# Patient Record
Sex: Male | Born: 1995 | Race: White | Hispanic: No | Marital: Single
Health system: Midwestern US, Community
[De-identification: ages and names within clinical notes are randomized; demographics above are authoritative.]

## PROBLEM LIST (undated history)

## (undated) DIAGNOSIS — J45909 Unspecified asthma, uncomplicated: Secondary | ICD-10-CM

## (undated) DIAGNOSIS — F909 Attention-deficit hyperactivity disorder, unspecified type: Secondary | ICD-10-CM

## (undated) DIAGNOSIS — F418 Other specified anxiety disorders: Secondary | ICD-10-CM

## (undated) DIAGNOSIS — R51 Headache: Secondary | ICD-10-CM

## (undated) DIAGNOSIS — Z8619 Personal history of other infectious and parasitic diseases: Secondary | ICD-10-CM

## (undated) DIAGNOSIS — R519 Headache, unspecified: Secondary | ICD-10-CM

## (undated) DIAGNOSIS — R12 Heartburn: Secondary | ICD-10-CM

## (undated) HISTORY — DX: Headache: R51

## (undated) HISTORY — PX: NO PAST SURGERIES: SHX2092

## (undated) HISTORY — DX: Heartburn: R12

## (undated) HISTORY — DX: Personal history of other infectious and parasitic diseases: Z86.19

## (undated) HISTORY — DX: Headache, unspecified: R51.9

## (undated) HISTORY — DX: Other specified anxiety disorders: F41.8

## (undated) HISTORY — DX: Attention-deficit hyperactivity disorder, unspecified type: F90.9

## (undated) HISTORY — DX: Unspecified asthma, uncomplicated: J45.909

---
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
## 2016-01-03 ENCOUNTER — Encounter: Payer: Self-pay | Admitting: Family Medicine

## 2016-01-04 ENCOUNTER — Encounter: Payer: Self-pay | Admitting: Primary Care

## 2016-01-07 ENCOUNTER — Ambulatory Visit (INDEPENDENT_AMBULATORY_CARE_PROVIDER_SITE_OTHER): Payer: BLUE CROSS/BLUE SHIELD | Admitting: Family Medicine

## 2016-01-07 ENCOUNTER — Encounter: Payer: Self-pay | Admitting: Family Medicine

## 2016-01-07 VITALS — BP 122/78 | HR 80 | Temp 97.9°F | Wt 121.8 lb

## 2016-01-07 DIAGNOSIS — F418 Other specified anxiety disorders: Secondary | ICD-10-CM

## 2016-01-07 DIAGNOSIS — Z23 Encounter for immunization: Secondary | ICD-10-CM | POA: Diagnosis not present

## 2016-01-07 DIAGNOSIS — F902 Attention-deficit hyperactivity disorder, combined type: Secondary | ICD-10-CM | POA: Diagnosis not present

## 2016-01-07 DIAGNOSIS — J4530 Mild persistent asthma, uncomplicated: Secondary | ICD-10-CM

## 2016-01-07 DIAGNOSIS — N529 Male erectile dysfunction, unspecified: Secondary | ICD-10-CM | POA: Insufficient documentation

## 2016-01-07 NOTE — [facility] Notes (Signed)
Pre visit review using our clinic review tool, if applicable. No additional management support is needed unless otherwise documented below in the visit note. 

## 2016-01-07 NOTE — Assessment & Plan Note (Addendum)
Endorses absent nocturnal tumescence and progressive onset of erectile dysfunction, although longstanding issue present. 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 Pt agrees with plan.

## 2016-01-07 NOTE — Progress Notes (Addendum)
BP 122/78   Pulse 80   Temp 97.9 F (36.6 C) (Oral)   Wt 121 lb 12 oz (55.2 kg)   BMI 17.85 kg/m    CC: erectile dysfunction, sexual dysfunction Subjective:    Patient ID: Albert Salas, male    DOB: February 05, 1996, 20 y.o.   MRN: [id number]  HPI: Albert Salas is a 20 y.o. male presenting on 01/07/2016 for Follow-up   See prior notes for details. Seen here 3 mo ago with commencement of wellbutrin 75mg  for depression/anxiety and ADHD. Also was taking adderall IR 15mg  daily for ADHD. Wellbutrin was decreased to once daily dosing due to noted tachycardia on my last visit.   Patient saw Anda Kraft NP 2 wks ago with ED concerns with normal genital exam last visit, thought psychologic in nature. labwork unrevealing (TSH, CBC, CMP, testosterone). Present when trying to be intimate with GF. Has not had successful sexual encounter yet. New relation over last 1.5 months. Very interested in this girl. Feels comfortable around GF. Denies significant life stressors. Decreased sex drive noted as well. Trouble with arousal both with GF and when alone. Endorses previous successful ejaculation but never associated with full erection. Rarely wakes up with an erection over the past year. He used to previously.   Facial hair since 7th grade. Puberty reached at 6th grade ~20 yo.  No fmhx developmental or congenital abnormalities.   Ran out of minocycline several months ago.   Going to school at [employer]. Playing gigs around town. Plays sax, bass guitar, clarinet and flute.   Alcohol - very rare.  No smoking.  Denies recreational drugs. Prior when at [facility] did have daily MJ use. Abstinent over >4 months.   Relevant past medical, surgical, family and social history reviewed and updated as indicated. Interim medical history since our last visit reviewed. Allergies and medications reviewed and updated. Current Outpatient Prescriptions on File Prior to Visit    Medication Sig  . mometasone (ASMANEX) 220 MCG/INH inhaler Inhale 1 puff into the lungs daily.  Marland Kitchen albuterol (PROAIR HFA) 108 (90 Base) MCG/ACT inhaler Inhale 2 puffs into the lungs every 6 (six) hours as needed for wheezing or shortness of breath.   No current facility-administered medications on file prior to visit.     Review of Systems Per HPI unless specifically indicated in ROS section     Objective:    BP 122/78   Pulse 80   Temp 97.9 F (36.6 C) (Oral)   Wt 121 lb 12 oz (55.2 kg)   BMI 17.85 kg/m   Wt Readings from Last 3 Encounters:  01/07/16 121 lb 12 oz (55.2 kg) (5 %, Z= -1.68)*  12/25/15 124 lb 12.8 oz (56.6 kg) (7 %, Z= -1.49)*  09/25/15 122 lb 8 oz (55.6 kg) (5 %, Z= -1.60)*   * Growth percentiles are based on CDC 2-20 Years data.    Physical Exam  Constitutional: He is oriented to person, place, and time. He appears well-developed and well-nourished. No distress.  HENT:  Mouth/Throat: Oropharynx is clear and moist. No oropharyngeal exudate.  Eyes: Conjunctivae are normal. Pupils are equal, round, and reactive to light.  Cardiovascular: Normal rate, regular rhythm, normal heart sounds and intact distal pulses.   No murmur heard. Pulmonary/Chest: Effort normal and breath sounds normal. No respiratory distress. He has no wheezes. He has no rales.  Abdominal: Soft. Bowel sounds are normal. He exhibits no distension and no mass. There is no  tenderness. There is no rebound and no guarding. Hernia confirmed negative in the right inguinal area and confirmed negative in the left inguinal area.  Genitourinary: Testes normal and penis normal. Right testis shows no mass, no swelling and no tenderness. Right testis is descended. Left testis shows no mass, no swelling and no tenderness. Left testis is descended. No phimosis, paraphimosis, hypospadias or penile tenderness. No discharge found.  Genitourinary Comments: No dorsal penile artery pulse appreciated  Musculoskeletal:  He exhibits no edema.  Lymphadenopathy:       Right: No inguinal adenopathy present.       Left: No inguinal adenopathy present.  Neurological: He is alert and oriented to person, place, and time.  Skin: Skin is warm and dry. No rash noted.  Psychiatric: He has a normal mood and affect.  Nursing note and vitals reviewed.  Results for orders placed or performed in visit on 12/25/15  Comprehensive metabolic panel  Result Value Ref Range   Sodium 141 135 - 145 mEq/L   Potassium 4.2 3.5 - 5.1 mEq/L   Chloride 104 96 - 112 mEq/L   CO2 34 (H) 19 - 32 mEq/L   Glucose, Bld 57 (L) 70 - 99 mg/dL   BUN 14 6 - 23 mg/dL   Creatinine, Ser 0.87 0.40 - 1.50 mg/dL   Total Bilirubin 0.8 0.2 - 1.2 mg/dL   Alkaline Phosphatase 57 52 - 171 U/L   AST 26 0 - 37 U/L   ALT 21 0 - 53 U/L   Total Protein 6.8 6.0 - 8.3 g/dL   Albumin 4.1 3.5 - 5.2 g/dL   Calcium 9.2 8.4 - 10.5 mg/dL   GFR 119.30 >60.00 mL/min  TSH  Result Value Ref Range   TSH 0.56 0.40 - 5.00 uIU/mL  Testosterone  Result Value Ref Range   Testosterone 361.15 200.00 - 970.00 ng/dL  CBC  Result Value Ref Range   WBC 4.6 4.5 - 13.5 K/uL   RBC 5.17 3.80 - 5.70 Mil/uL   Platelets 215.0 150.0 - 575.0 K/uL   Hemoglobin 15.6 12.0 - 16.0 g/dL   HCT 47.0 36.0 - 49.0 %   MCV 90.9 78.0 - 98.0 fl   MCHC 33.3 31.0 - 37.0 g/dL   RDW 13.3 11.4 - 15.5 %      Assessment & Plan:   Problem List Items Addressed This Visit    ADHD (attention deficit hyperactivity disorder)    adderall stopped >1 month ago. Feels ADHD stable off medication, functioning well at work and at school. Will remain off adderall at this time.       Asthma    Regular use of daily asmanex.       Depression with anxiety    wellbutrin stopped >1 month ago. Feels anxiety /depression stable at this time off antidepressant.       Erectile dysfunction - Primary    Endorses absent nocturnal tumescence and progressive onset of erectile dysfunction, although longstanding  issue present. 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 Pt agrees with plan.      Relevant Orders   Ambulatory referral to Urology    Other Visit Diagnoses    Need for influenza vaccination       Relevant Orders   Flu Vaccine QUAD 36+ mos PF IM (Fluarix & Fluzone Quad PF) (Completed)       Follow  up plan: No Follow-up on file.  Ria Bush, MD

## 2016-01-07 NOTE — Patient Instructions (Addendum)
Flu shot today.  Let's refer you to urologist for further evaluation.  Let's stay off adderall and wellbutrin for now.

## 2016-01-08 NOTE — Assessment & Plan Note (Signed)
adderall stopped >1 month ago. Feels ADHD stable off medication, functioning well at work and at school. Will remain off adderall at this time.

## 2016-01-08 NOTE — Assessment & Plan Note (Signed)
wellbutrin stopped >1 month ago. Feels anxiety /depression stable at this time off antidepressant.

## 2016-01-08 NOTE — Assessment & Plan Note (Signed)
Regular use of daily asmanex.

## 2016-01-08 NOTE — Addendum Note (Signed)
Addended by: Ria Bush on: 01/08/2016 07:24 AM   Modules accepted: Orders

## 2016-01-16 ENCOUNTER — Ambulatory Visit (INDEPENDENT_AMBULATORY_CARE_PROVIDER_SITE_OTHER): Payer: BLUE CROSS/BLUE SHIELD | Admitting: Urology

## 2016-01-16 ENCOUNTER — Encounter: Payer: Self-pay | Admitting: Urology

## 2016-01-16 VITALS — BP 148/71 | HR 62 | Ht 70.0 in | Wt 125.9 lb

## 2016-01-16 DIAGNOSIS — N529 Male erectile dysfunction, unspecified: Secondary | ICD-10-CM

## 2016-01-16 DIAGNOSIS — F3289 Other specified depressive episodes: Secondary | ICD-10-CM

## 2016-01-16 DIAGNOSIS — N486 Induration penis plastica: Secondary | ICD-10-CM | POA: Diagnosis not present

## 2016-01-16 MED ORDER — TADALAFIL 5 MG PO TABS: 5.0000 mg | ORAL_TABLET | Freq: Every day | ORAL | 3 refills | Status: DC | PRN

## 2016-01-16 NOTE — Progress Notes (Signed)
01/16/2016 3:28 PM   Albert Salas 02-25-1996 [id number]  Referring provider: Ria Bush, MD [address] [place], [place] [address]  Chief Complaint  Patient presents with  . Erectile Dysfunction    new patient referred by Ria Bush    HPI: Patient is a 20 year old Caucasian male who is referred for ED by Dr. Danise Mina.  Patient feels that he has had issues with erectile dysfunction since his early teenage years.  He is having spontaneous erections at night.  He has not had pain with erections or curvature.  He has not had any trauma to the penis.    He is not having any urinary symptoms at this time.    He is having semi-rigid erections with masturbation.  He cannot have erections with a partner.    He has been on Wellbutrin and Adderall, but he has discontinued the medication in hopes of an improvement in his erections.    His serum testosterone was 361.15 ng/dL on 12/25/2015.   His TSH was 0.56 uIU/mL.    His SHIM score is 5.        SHIM    Row Name 01/16/16 1457         SHIM: Over the last 6 months:   How do you rate your confidence that you could get and keep an erection? Very Low     When you had erections with sexual stimulation, how often were your erections hard enough for penetration (entering your partner)? Almost Never or Never     During sexual intercourse, how often were you able to maintain your erection after you had penetrated (entered) your partner? Extremely Difficult     During sexual intercourse, how difficult was it to maintain your erection to completion of intercourse? Extremely Difficult     When you attempted sexual intercourse, how often was it satisfactory for you? Extremely Difficult       SHIM Total Score   SHIM 5        Score: 1-7 Severe ED 8-11 Moderate ED 12-16 Mild-Moderate ED 17-21 Mild ED 22-25 No ED     PMH: Past Medical History:  Diagnosis Date  . ADHD (attention deficit hyperactivity  disorder) 2008   dx in 4th grade, s/p psychological testing (no records), prior tried intuniv and vyvanse  . Asthma   . Depression with anxiety   . Headache   . Heartburn   . History of chicken pox     Surgical History: Past Surgical History:  Procedure Laterality Date  . NO PAST SURGERIES      Home Medications:    Medication List       Accurate as of 01/16/16  3:28 PM. Always use your most recent med list.          ADDERALL 15 MG tablet Generic drug:  amphetamine-dextroamphetamine Take 15 mg by mouth daily.   buPROPion 75 MG tablet Commonly known as:  WELLBUTRIN Take 75 mg by mouth 2 (two) times daily.   minocycline 50 MG tablet Commonly known as:  DYNACIN Take 50 mg by mouth 2 (two) times daily.   mometasone 220 MCG/INH inhaler Commonly known as:  ASMANEX Inhale 1 puff into the lungs daily.   PROAIR HFA 108 (90 Base) MCG/ACT inhaler Generic drug:  albuterol Inhale 2 puffs into the lungs every 6 (six) hours as needed for wheezing or shortness of breath.   tadalafil 5 MG tablet Commonly known as:  CIALIS Take 1  tablet (5 mg total) by mouth daily as needed for erectile dysfunction.       Allergies: No Known Allergies  Family History: Family History  Problem Relation Age of Onset  . Diabetes Father   . Depression Mother   . Cancer Maternal Grandmother     colon  . Cancer Other     ?uterine/ovarian  . Cancer Other     lung (maternal)  . CAD Neg Hx   . Kidney disease Neg Hx   . Prostate cancer Neg Hx     Social History:  reports that he has never smoked. He has never used smokeless tobacco. He reports that he drinks alcohol. He reports that he does not use drugs.  ROS: UROLOGY Frequent Urination?: No Hard to postpone urination?: No Burning/pain with urination?: No Get up at night to urinate?: No Leakage of urine?: No Urine stream starts and stops?: No Trouble starting stream?: No Do you have to strain to urinate?: No Blood in urine?:  No Urinary tract infection?: No Sexually transmitted disease?: No Injury to kidneys or bladder?: No Painful intercourse?: No Weak stream?: No Erection problems?: Yes Penile pain?: No  Gastrointestinal Nausea?: Yes Vomiting?: No Indigestion/heartburn?: No Diarrhea?: Yes Constipation?: No  Constitutional Fever: No Night sweats?: No Weight loss?: No Fatigue?: No  Skin Skin rash/lesions?: No Itching?: No  Eyes Blurred vision?: No Double vision?: No  Ears/Nose/Throat Sore throat?: No Sinus problems?: No  Hematologic/Lymphatic Swollen glands?: No Easy bruising?: No  Cardiovascular Leg swelling?: No Chest pain?: No  Respiratory Cough?: No Shortness of breath?: No  Endocrine Excessive thirst?: No  Musculoskeletal Back pain?: No Joint pain?: No  Neurological Headaches?: No Dizziness?: No  Psychologic Depression?: Yes Anxiety?: Yes  Physical Exam: BP (!) 148/71   Pulse 62   Ht 5\' 10"  (1.778 m)   Wt 125 lb 14.4 oz (57.1 kg)   BMI 18.06 kg/m   Constitutional: Well nourished. Alert and oriented, No acute distress. HEENT: [place] AT, moist mucus membranes. Trachea midline, no masses. Cardiovascular: No clubbing, cyanosis, or edema. Respiratory: Normal respiratory effort, no increased work of breathing. GI: Abdomen is soft, non tender, non distended, no abdominal masses. Liver and spleen not palpable.  No hernias appreciated.  Stool sample for occult testing is not indicated.   GU: No CVA tenderness.  No bladder fullness or masses.  Patient with circumcised phallus.  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     Urethral meatus is patent.  No penile discharge. No penile lesions or rashes. Scrotum without lesions, cysts, rashes and/or edema.  Testicles are located scrotally bilaterally. No masses are appreciated in the testicles. Left and right epididymis are normal. Rectal: Deferred. Skin: No rashes, bruises or suspicious lesions.   Lymph: No cervical or inguinal  adenopathy. Neurologic: Grossly intact, no focal deficits, moving all 4 extremities. Psychiatric: Normal mood and affect.  Laboratory Data: Lab Results  Component Value Date   WBC 4.6 12/25/2015   HGB 15.6 12/25/2015   HCT 47.0 12/25/2015   MCV 90.9 12/25/2015   PLT 215.0 12/25/2015    Lab Results  Component Value Date   CREATININE 0.87 12/25/2015    Lab Results  Component Value Date   TESTOSTERONE 361.15 12/25/2015    Lab Results  Component Value Date   TSH 0.56 12/25/2015    Lab Results  Component Value Date   AST 26 12/25/2015   Lab Results  Component Value Date   ALT 21 12/25/2015     Assessment &  Plan:    1. 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 Peyronie's  - will continue to monitor  - explained natural history to the patient  3. Depression  - refer to psychiatry   Return in about 1 month (around 02/16/2016) for SHIM .  These notes generated with voice recognition software. I apologize for typographical errors.  Zara Council, [facility] [address], [place], [place] [address] [phone]

## 2016-02-20 ENCOUNTER — Ambulatory Visit: Payer: Self-pay | Admitting: Urology

## 2016-11-03 ENCOUNTER — Encounter: Payer: Self-pay | Admitting: Family Medicine

## 2016-11-03 ENCOUNTER — Telehealth: Payer: Self-pay | Admitting: Family Medicine

## 2016-11-03 NOTE — Telephone Encounter (Signed)
Pt had a printed rx for Amphetamine-dextroamphetamine( adderall xr) 25mg  24hr cap that was not picked up. According to date on envelope was placed up front on 09/14/15. According to ov note with Dr. Darnell Level on 01/07/16 pt was to hold off on taking this medication due to what he was being seen for. Rx has been placed in shreds.  Pt has not follow up appts.

## 2017-04-24 ENCOUNTER — Ambulatory Visit: Payer: BLUE CROSS/BLUE SHIELD | Admitting: Family Medicine

## 2017-04-24 ENCOUNTER — Encounter: Payer: Self-pay | Admitting: Family Medicine

## 2017-04-24 VITALS — BP 118/62 | HR 74 | Temp 97.9°F | Wt 133.2 lb

## 2017-04-24 DIAGNOSIS — F902 Attention-deficit hyperactivity disorder, combined type: Secondary | ICD-10-CM | POA: Diagnosis not present

## 2017-04-24 DIAGNOSIS — Z23 Encounter for immunization: Secondary | ICD-10-CM | POA: Diagnosis not present

## 2017-04-24 DIAGNOSIS — F418 Other specified anxiety disorders: Secondary | ICD-10-CM

## 2017-04-24 NOTE — Patient Instructions (Addendum)
Flu shot today Good to see yo, call us with questions.

## 2017-04-24 NOTE — Addendum Note (Signed)
Addended by: Brenton Grills on: 4/35/6861 68:37 AM   Modules accepted: Orders

## 2017-04-24 NOTE — Progress Notes (Signed)
   BP 118/62 (BP Location: Left Arm, Patient Position: Sitting, Cuff Size: Normal)   Pulse 74   Temp 97.9 F (36.6 C) (Oral)   Wt 133 lb 4 oz (60.4 kg)   SpO2 99%   BMI 19.12 kg/m    CC: letter for animal support. Subjective:    Patient ID: Albert Salas, male    DOB: 1995/06/08, 22 y.o.   MRN: [id number]  HPI: Albert Salas is a 22 y.o. male presenting on 04/24/2017 for Letter request (Request letter for support dog )   Known depression, anxiety, and ADHD off adderall.  Back in school at [employer]. Classes going well.  Lives in apartment by himself. Wants to get a dog.  Has support certification. He feels a pet would help him manage his mood well and for emotional support. Needs letter from doctor saying ok to have dog.   Relevant past medical, surgical, family and social history reviewed and updated as indicated. Interim medical history since our last visit reviewed. Allergies and medications reviewed and updated. Outpatient Medications Prior to Visit  Medication Sig Dispense Refill  . albuterol (PROAIR HFA) 108 (90 Base) MCG/ACT inhaler Inhale 2 puffs into the lungs every 6 (six) hours as needed for wheezing or shortness of breath.    . mometasone (ASMANEX) 220 MCG/INH inhaler Inhale 1 puff into the lungs daily.    Marland Kitchen amphetamine-dextroamphetamine (ADDERALL) 15 MG tablet Take 15 mg by mouth daily.    Marland Kitchen buPROPion (WELLBUTRIN) 75 MG tablet Take 75 mg by mouth 2 (two) times daily.    . minocycline (DYNACIN) 50 MG tablet Take 50 mg by mouth 2 (two) times daily.    . tadalafil (CIALIS) 5 MG tablet Take 1 tablet (5 mg total) by mouth daily as needed for erectile dysfunction. 30 tablet 3   No facility-administered medications prior to visit.      Per HPI unless specifically indicated in ROS section below Review of Systems     Objective:    BP 118/62 (BP Location: Left Arm, Patient Position: Sitting, Cuff Size: Normal)   Pulse 74   Temp  97.9 F (36.6 C) (Oral)   Wt 133 lb 4 oz (60.4 kg)   SpO2 99%   BMI 19.12 kg/m   Wt Readings from Last 3 Encounters:  04/24/17 133 lb 4 oz (60.4 kg)  01/16/16 125 lb 14.4 oz (57.1 kg) (8 %, Z= -1.43)*  01/07/16 121 lb 12 oz (55.2 kg) (5 %, Z= -1.68)*   * Growth percentiles are based on CDC (Boys, 2-20 Years) data.    Physical Exam  Constitutional: He appears well-developed and well-nourished. No distress.  Psychiatric: He has a normal mood and affect. His behavior is normal.  Nursing note and vitals reviewed.     Assessment & Plan:   Problem List Items Addressed This Visit    ADHD (attention deficit hyperactivity disorder)    Doing well off medication. Succeeding in school.      Depression with anxiety - Primary    Doing well off medication. Requests letter of support to have pet at apartment complex. Letter provided. He states he already has certification for support animal.           Follow up plan: No Follow-up on file.  Ria Bush, MD

## 2017-04-24 NOTE — Assessment & Plan Note (Signed)
Doing well off medication. Succeeding in school.

## 2017-04-24 NOTE — Assessment & Plan Note (Signed)
Doing well off medication. Requests letter of support to have pet at apartment complex. Letter provided. He states he already has certification for support animal.

## 2017-04-27 ENCOUNTER — Telehealth: Payer: Self-pay

## 2017-04-27 NOTE — Telephone Encounter (Signed)
Mailed another copy of letter to pt.

## 2017-04-27 NOTE — Telephone Encounter (Signed)
Copied from [place]. Topic: General - Other >> Apr 27, 2017 10:00 AM Synthia Innocent wrote: Reason for PHX:TAVW copy of letter for support dog, can this please be mailed to him?

## 2018-01-14 ENCOUNTER — Ambulatory Visit: Payer: BLUE CROSS/BLUE SHIELD | Admitting: Family Medicine

## 2018-01-14 DIAGNOSIS — Z0289 Encounter for other administrative examinations: Secondary | ICD-10-CM

## 2018-03-14 DIAGNOSIS — R519 Headache, unspecified: Secondary | ICD-10-CM | POA: Insufficient documentation

## 2018-03-14 DIAGNOSIS — R51 Headache: Secondary | ICD-10-CM

## 2018-03-14 NOTE — Progress Notes (Signed)
BP 120/78 (BP Location: Left Arm, Patient Position: Sitting, Cuff Size: Normal)   Pulse 67   Temp 98.2 F (36.8 C) (Oral)   Ht 5' 9.25" (1.759 m)   Wt 139 lb 8 oz (63.3 kg)   SpO2 98%   BMI 20.45 kg/m    Hearing Screening   125Hz  250Hz  500Hz  1000Hz  2000Hz  3000Hz  4000Hz  6000Hz  8000Hz   Right ear:   0 40 20  20    Left ear:   0 0 20  20      Visual Acuity Screening   Right eye Left eye Both eyes  Without correction: 20/20 20/20 20/20   With correction:       CC: discuss HA Subjective:    Patient ID: Albert Salas, male    DOB: Feb 09, 1996, 22 y.o.   MRN: [id number]  HPI: Albert Salas is a 22 y.o. male presenting on 03/15/2018 for Headache (C/o HA for 1 yr. Pain is located in bilateral temporal regions and is occurring more frequently. Tries Advil, helpful. Per mom, pt suffered with HAs in high school. Pt accompanied by his mom, Butch Penny. 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 Describes bilateral bitemporal throbbing aches at times sharp. Sometimes feels slightly dizzy. Headaches relieved by advil or a long nap. Treats with advil 600mg , about 4 times a week. Exacerbated by heavy labor at work [employer]) and bending forward. No aura. No nausea/vomiting, photo/phonophobia. Denies inciting trauma/injury or falls. No neck pain.   No fevers/chills, burning pain, paresthesias, numbness, sinus congestion or rhinorrhea, vision changes, dyspnea or wheezing.   Maternal uncle had brain cancer age 49yo.   H/o headaches in HS but not as frequently.  Sleeps well. Averages 7-8 hrs sleep.  Currently on Christmas break.  Drinks milk, limits soda 1-2/day. No regular caffeine.  Significant stress at school.   Going to [employer]. Getting Cs. Very stressed with school.   Relevant past medical, surgical, family and social history reviewed and updated as indicated. Interim medical history since our last visit  reviewed. Allergies and medications reviewed and updated. Outpatient Medications Prior to Visit  Medication Sig Dispense Refill  . albuterol (PROAIR HFA) 108 (90 Base) MCG/ACT inhaler Inhale 2 puffs into the lungs every 6 (six) hours as needed for wheezing or shortness of breath.    . mometasone (ASMANEX) 220 MCG/INH inhaler Inhale 1 puff into the lungs daily.     No facility-administered medications prior to visit.      Per HPI unless specifically indicated in ROS section below Review of Systems     Objective:    BP 120/78 (BP Location: Left Arm, Patient Position: Sitting, Cuff Size: Normal)   Pulse 67   Temp 98.2 F (36.8 C) (Oral)   Ht 5' 9.25" (1.759 m)   Wt 139 lb 8 oz (63.3 kg)   SpO2 98%   BMI 20.45 kg/m   Wt Readings from Last 3 Encounters:  03/15/18 139 lb 8 oz (63.3 kg)  04/24/17 133 lb 4 oz (60.4 kg)  01/16/16 125 lb 14.4 oz (57.1 kg) (8 %, Z= -1.43)*   * Growth percentiles are based on CDC (Boys, 2-20 Years) data.    Physical Exam Vitals signs and nursing note reviewed.  Constitutional:      General: He is not in acute distress.    Appearance: He is well-developed.  HENT:     Head: Normocephalic and atraumatic.  Mouth/Throat:     Mouth: Mucous membranes are moist.  Neck:     Musculoskeletal: Normal range of motion.  Cardiovascular:     Rate and Rhythm: Normal rate and regular rhythm.     Heart sounds: Normal heart sounds. No murmur.  Pulmonary:     Effort: Pulmonary effort is normal. No respiratory distress.     Breath sounds: Normal breath sounds. No wheezing, rhonchi or rales.  Neurological:     Mental Status: He is alert.     Cranial Nerves: No cranial nerve deficit, dysarthria or facial asymmetry.     Sensory: Sensation is intact.     Motor: Motor function is intact.     Coordination: Coordination is intact. Romberg sign negative. Coordination normal. Finger-Nose-Finger Test normal.     Gait: Gait is intact.     Comments:  CN 2-12  intact FTN intact EOMI No pronator drift  Psychiatric:        Mood and Affect: Mood normal.        Behavior: Behavior normal.    Results for orders placed or performed in visit on 12/25/15  Comprehensive metabolic panel  Result Value Ref Range   Sodium 141 135 - 145 mEq/L   Potassium 4.2 3.5 - 5.1 mEq/L   Chloride 104 96 - 112 mEq/L   CO2 34 (H) 19 - 32 mEq/L   Glucose, Bld 57 (L) 70 - 99 mg/dL   BUN 14 6 - 23 mg/dL   Creatinine, Ser 0.87 0.40 - 1.50 mg/dL   Total Bilirubin 0.8 0.2 - 1.2 mg/dL   Alkaline Phosphatase 57 52 - 171 U/L   AST 26 0 - 37 U/L   ALT 21 0 - 53 U/L   Total Protein 6.8 6.0 - 8.3 g/dL   Albumin 4.1 3.5 - 5.2 g/dL   Calcium 9.2 8.4 - 10.5 mg/dL   GFR 119.30 >60.00 mL/min  TSH  Result Value Ref Range   TSH 0.56 0.40 - 5.00 uIU/mL  Testosterone  Result Value Ref Range   Testosterone 361.15 200.00 - 970.00 ng/dL  CBC  Result Value Ref Range   WBC 4.6 4.5 - 13.5 K/uL   RBC 5.17 3.80 - 5.70 Mil/uL   Platelets 215.0 150.0 - 575.0 K/uL   Hemoglobin 15.6 12.0 - 16.0 g/dL   HCT 47.0 36.0 - 49.0 %   MCV 90.9 78.0 - 98.0 fl   MCHC 33.3 31.0 - 37.0 g/dL   RDW 13.3 11.4 - 15.5 %      Assessment & Plan:   Problem List Items Addressed This Visit    Headache - Primary    Longstanding headaches that do not sound like migraines. Overall non-focal neurological exam. Possible MOH component - suggested trial flexeril PRN HA in place of NSAID. Provided with headache diary to try and identify triggers for headaches.  Normal snellen. I did suggest formal vision exam.  Given progression of headache, will ask to return for labwork and if normal consider imaging with nocontrasted head CT if no improvement with above to r/o other secondary cause like mass etc.       Relevant Medications   cyclobenzaprine (FLEXERIL) 5 MG tablet   Other Relevant Orders   TSH   CBC with Differential/Platelet   Basic metabolic panel    Other Visit Diagnoses    Need for influenza  vaccination       Relevant Orders   Flu Vaccine QUAD 36+ mos IM (Completed)  Meds ordered this encounter  Medications  . albuterol (PROAIR HFA) 108 (90 Base) MCG/ACT inhaler    Sig: Inhale 2 puffs into the lungs every 6 (six) hours as needed for wheezing or shortness of breath.    Dispense:  18 g    Refill:  3  . cyclobenzaprine (FLEXERIL) 5 MG tablet    Sig: Take 1-2 tablets (5-10 mg total) by mouth 2 (two) times daily as needed (headache - with sedation precautions).    Dispense:  25 tablet    Refill:  0   Orders Placed This Encounter  Procedures  . Flu Vaccine QUAD 36+ mos IM  . TSH    Standing Status:   Future    Standing Expiration Date:   03/17/2019  . CBC with Differential/Platelet    Standing Status:   Future    Standing Expiration Date:   03/17/2019  . Basic metabolic panel    Standing Status:   Future    Standing Expiration Date:   03/17/2019    Follow up plan: No follow-ups on file.  Ria Bush, MD

## 2018-03-15 ENCOUNTER — Ambulatory Visit: Payer: BLUE CROSS/BLUE SHIELD | Admitting: Family Medicine

## 2018-03-15 ENCOUNTER — Encounter: Payer: Self-pay | Admitting: Family Medicine

## 2018-03-15 VITALS — BP 120/78 | HR 67 | Temp 98.2°F | Ht 69.25 in | Wt 139.5 lb

## 2018-03-15 DIAGNOSIS — Z23 Encounter for immunization: Secondary | ICD-10-CM | POA: Diagnosis not present

## 2018-03-15 DIAGNOSIS — R519 Headache, unspecified: Secondary | ICD-10-CM

## 2018-03-15 DIAGNOSIS — R51 Headache: Secondary | ICD-10-CM | POA: Diagnosis not present

## 2018-03-15 MED ORDER — CYCLOBENZAPRINE HCL 5 MG PO TABS: 5.0000 mg | ORAL_TABLET | Freq: Two times a day (BID) | ORAL | 0 refills | Status: DC | PRN

## 2018-03-15 MED ORDER — ALBUTEROL SULFATE HFA 108 (90 BASE) MCG/ACT IN AERS: 2.0000 | INHALATION_SPRAY | Freq: Four times a day (QID) | RESPIRATORY_TRACT | 3 refills | Status: DC | PRN

## 2018-03-15 NOTE — Patient Instructions (Addendum)
Hearing and vision screen today.  Try flexeril muscle relaxant in place of advil - 5mg  as needed. Caution it can make you sleepy.  Schedule eye exam.  Keep headache diary to try and identify triggers for headaches.  Work on healthy stress relieving strategies.   General Headache Without Cause A headache is pain or discomfort felt around the head or neck area. There are many causes and types of headaches. In some cases, the cause may not be found. Follow these instructions at home: Managing pain  Take over-the-counter and prescription medicines only as told by your doctor.  Lie down in a dark, quiet room when you have a headache.  If directed, apply ice to the head and neck area: ? Put ice in a plastic bag. ? Place a towel between your skin and the bag. ? Leave the ice on for 20 minutes, 2-3 times per day.  Use a heating pad or hot shower to apply heat to the head and neck area as told by your doctor.  Keep lights dim if bright lights bother you or make your headaches worse. Eating and drinking  Eat meals on a regular schedule.  Lessen how much alcohol you drink.  Lessen how much caffeine you drink, or stop drinking caffeine. General instructions  Keep all follow-up visits as told by your doctor. This is important.  Keep a journal to find out if certain things bring on headaches. For example, write down: ? What you eat and drink. ? How much sleep you get. ? Any change to your diet or medicines.  Relax by getting a massage or doing other relaxing activities.  Lessen stress.  Sit up straight. Do not tighten (tense) your muscles.  Do not use tobacco products. This includes cigarettes, chewing tobacco, or e-cigarettes. If you need help quitting, ask your doctor.  Exercise regularly as told by your doctor.  Get enough sleep. This often means 7-9 hours of sleep. Contact a doctor if:  Your symptoms are not helped by medicine.  You have a headache that feels different  than the other headaches.  You feel sick to your stomach (nauseous) or you throw up (vomit).  You have a fever. Get help right away if:  Your headache becomes really bad.  You keep throwing up.  You have a stiff neck.  You have trouble seeing.  You have trouble speaking.  You have pain in the eye or ear.  Your muscles are weak or you lose muscle control.  You lose your balance or have trouble walking.  You feel like you will pass out (faint) or you pass out.  You have confusion. This information is not intended to replace advice given to you by your health care provider. Make sure you discuss any questions you have with your health care provider. Document Released: 12/25/2007 Document Revised: 08/23/2015 Document Reviewed: 07/10/2014 Elsevier Interactive Patient Education  [employer].

## 2018-03-16 ENCOUNTER — Telehealth: Payer: Self-pay | Admitting: Family Medicine

## 2018-03-16 NOTE — Telephone Encounter (Addendum)
Left message with pt's mother, Butch Penny (on dpr), relaying Dr. Synthia Innocent message. Asked her to have pt call to schedule lab visit.

## 2018-03-16 NOTE — Telephone Encounter (Signed)
plz call - given progression of headache recently I would like to check some labwork on him - kidneys, blood counts, thyroid. Have him come in at his convenience for this.  If all normal, we may get head imaging to further evaluate headaches.

## 2018-03-16 NOTE — Assessment & Plan Note (Addendum)
Longstanding headaches that do not sound like migraines. Overall non-focal neurological exam. Possible MOH component - suggested trial flexeril PRN HA in place of NSAID. Provided with headache diary to try and identify triggers for headaches.  Normal snellen. I did suggest formal vision exam.  Given progression of headache, will ask to return for labwork and if normal consider imaging with nocontrasted head CT if no improvement with above to r/o other secondary cause like mass etc.

## 2018-03-17 ENCOUNTER — Other Ambulatory Visit (INDEPENDENT_AMBULATORY_CARE_PROVIDER_SITE_OTHER): Payer: BLUE CROSS/BLUE SHIELD

## 2018-03-17 DIAGNOSIS — R519 Headache, unspecified: Secondary | ICD-10-CM

## 2018-03-17 DIAGNOSIS — R51 Headache: Secondary | ICD-10-CM

## 2018-03-17 LAB — CBC WITH DIFFERENTIAL/PLATELET
BASOS PCT: 0.9 % (ref 0.0–3.0)
Basophils Absolute: 0 10*3/uL (ref 0.0–0.1)
EOS ABS: 0.1 10*3/uL (ref 0.0–0.7)
EOS PCT: 3.4 % (ref 0.0–5.0)
HEMATOCRIT: 47.7 % (ref 39.0–52.0)
Hemoglobin: 15.9 g/dL (ref 13.0–17.0)
LYMPHS PCT: 34.1 % (ref 12.0–46.0)
Lymphs Abs: 1.4 10*3/uL (ref 0.7–4.0)
MCHC: 33.3 g/dL (ref 30.0–36.0)
MCV: 89.7 fl (ref 78.0–100.0)
Monocytes Absolute: 0.4 10*3/uL (ref 0.1–1.0)
Monocytes Relative: 10.7 % (ref 3.0–12.0)
NEUTROS ABS: 2.1 10*3/uL (ref 1.4–7.7)
Neutrophils Relative %: 50.9 % (ref 43.0–77.0)
Platelets: 230 10*3/uL (ref 150.0–400.0)
RBC: 5.32 Mil/uL (ref 4.22–5.81)
RDW: 13.1 % (ref 11.5–15.5)
WBC: 4.1 10*3/uL (ref 4.0–10.5)

## 2018-03-17 LAB — TSH: TSH: 1.47 u[IU]/mL (ref 0.35–4.50)

## 2018-03-17 LAB — BASIC METABOLIC PANEL
BUN: 15 mg/dL (ref 6–23)
CHLORIDE: 102 meq/L (ref 96–112)
CO2: 27 meq/L (ref 19–32)
CREATININE: 0.87 mg/dL (ref 0.40–1.50)
Calcium: 9.7 mg/dL (ref 8.4–10.5)
GFR: 116.73 mL/min (ref >60.00)
Glucose, Bld: 86 mg/dL (ref 70–99)
POTASSIUM: 4.3 meq/L (ref 3.5–5.1)
Sodium: 138 mEq/L (ref 135–145)

## 2018-03-18 NOTE — Telephone Encounter (Signed)
Pt had labs done on 03/17/18.

## 2018-10-04 ENCOUNTER — Encounter: Payer: Self-pay | Admitting: Family Medicine

## 2018-10-04 ENCOUNTER — Ambulatory Visit (INDEPENDENT_AMBULATORY_CARE_PROVIDER_SITE_OTHER): Payer: BC Managed Care – PPO | Admitting: Family Medicine

## 2018-10-04 VITALS — Ht 69.25 in | Wt 135.0 lb

## 2018-10-04 DIAGNOSIS — F988 Other specified behavioral and emotional disorders with onset usually occurring in childhood and adolescence: Secondary | ICD-10-CM | POA: Diagnosis not present

## 2018-10-04 MED ORDER — AMPHETAMINE-DEXTROAMPHETAMINE 10 MG PO TABS: 10.0000 mg | ORAL_TABLET | Freq: Every day | ORAL | 0 refills | Status: DC | PRN

## 2018-10-04 NOTE — Assessment & Plan Note (Signed)
Reviewed finding best study strategy for himself. Reviewed importance of continued tutoring as needed. Reasonable to try adderall IR will start at 10mg  to take PRN.  He will let us know if trouble tolerating medicine or if any HA, chest pain, loss of weight /appetite or insomnia develops.

## 2018-10-04 NOTE — Progress Notes (Signed)
Virtual visit completed through [employer]. Due to national recommendations of social distancing due to COVID-19, a virtual visit is felt to be most appropriate for this patient at this time. Reviewed limitations of a virtual visit.   Patient location: home Provider location: [place] [facility], office If any vitals were documented, they were collected by patient at home unless specified below.    Ht 5' 9.25" (1.759 m)   Wt 135 lb (61.2 kg)   BMI 19.79 kg/m    CC: discuss ADD Subjective:    Patient ID: Albert Salas, male    DOB: 08-Aug-1995, 23 y.o.   MRN: [id number]  HPI: Albert Salas is a 23 y.o. male presenting on 10/04/2018 for ADHD (Wants to discuss due to returning to school in 10/2018.)   [employer] major. One particular class he is struggling on (dropped it) - involving extensive catalog searches to find information for homework. Finds he has to read same information over and over. Struggles to read through large amounts of information. Has been getting Bs and Cs, last semester did extra well with all As, 1 B but did drop difficult class. Worried with this coming year about success in finishing his degree due to difficult class experience last semester.   H/o ADHD prior on intuniv and vyvanse and adderall. Previously stopped as he was succeeding without medications, was underweight. Feels he's grown out of hyperactivity portion of ADHD.   Previously studied in group of 5 people and was more successful with this.   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      Relevant past medical, surgical, family and social history reviewed and updated as indicated. Interim medical history since our last visit reviewed. Allergies and medications reviewed and updated. Outpatient Medications Prior to Visit  Medication Sig Dispense Refill  . albuterol (PROAIR HFA) 108 (90 Base) MCG/ACT inhaler Inhale 2 puffs into the lungs every 6 (six) hours as  needed for wheezing or shortness of breath. 18 g 3  . cyclobenzaprine (FLEXERIL) 5 MG tablet Take 1-2 tablets (5-10 mg total) by mouth 2 (two) times daily as needed (headache - with sedation precautions). 25 tablet 0   No facility-administered medications prior to visit.      Per HPI unless specifically indicated in ROS section below Review of Systems Objective:    Ht 5' 9.25" (1.759 m)   Wt 135 lb (61.2 kg)   BMI 19.79 kg/m   Wt Readings from Last 3 Encounters:  10/04/18 135 lb (61.2 kg)  03/15/18 139 lb 8 oz (63.3 kg)  04/24/17 133 lb 4 oz (60.4 kg)     Physical exam: Gen: alert, NAD, not ill appearing Pulm: speaks in complete sentences without increased work of breathing Psych: normal mood, normal thought content      Results for orders placed or performed in visit on 76/73/41  Basic metabolic panel  Result Value Ref Range   Sodium 138 135 - 145 mEq/L   Potassium 4.3 3.5 - 5.1 mEq/L   Chloride 102 96 - 112 mEq/L   CO2 27 19 - 32 mEq/L   Glucose, Bld 86 70 - 99 mg/dL   BUN 15 6 - 23 mg/dL   Creatinine, Ser 0.87 0.40 - 1.50 mg/dL   Calcium 9.7 8.4 - 10.5 mg/dL   GFR 116.73 >60.00 mL/min  CBC with Differential/Platelet  Result Value Ref Range   WBC 4.1 4.0 - 10.5 K/uL   RBC 5.32 4.22 -  5.81 Mil/uL   Hemoglobin 15.9 13.0 - 17.0 g/dL   HCT 47.7 39.0 - 52.0 %   MCV 89.7 78.0 - 100.0 fl   MCHC 33.3 30.0 - 36.0 g/dL   RDW 13.1 11.5 - 15.5 %   Platelets 230.0 150.0 - 400.0 K/uL   Neutrophils Relative % 50.9 43.0 - 77.0 %   Lymphocytes Relative 34.1 12.0 - 46.0 %   Monocytes Relative 10.7 3.0 - 12.0 %   Eosinophils Relative 3.4 0.0 - 5.0 %   Basophils Relative 0.9 0.0 - 3.0 %   Neutro Abs 2.1 1.4 - 7.7 K/uL   Lymphs Abs 1.4 0.7 - 4.0 K/uL   Monocytes Absolute 0.4 0.1 - 1.0 K/uL   Eosinophils Absolute 0.1 0.0 - 0.7 K/uL   Basophils Absolute 0.0 0.0 - 0.1 K/uL  TSH  Result Value Ref Range   TSH 1.47 0.35 - 4.50 uIU/mL   Assessment & Plan:  Over 25 minutes were  spent face-to-face with the patient during this encounter and >50% of that time was spent on counseling and coordination of care  Problem List Items Addressed This Visit    ADD (attention deficit disorder)    Reviewed finding best study strategy for himself. Reviewed importance of continued tutoring as needed. Reasonable to try adderall IR will start at 10mg  to take PRN.  He will let us know if trouble tolerating medicine or if any HA, chest pain, loss of weight /appetite or insomnia develops.           Meds ordered this encounter  Medications  . amphetamine-dextroamphetamine (ADDERALL) 10 MG tablet    Sig: Take 1 tablet (10 mg total) by mouth daily as needed (attention).    Dispense:  30 tablet    Refill:  0   No orders of the defined types were placed in this encounter.   I discussed the assessment and treatment plan with the patient. The patient was provided an opportunity to ask questions and all were answered. The patient agreed with the plan and demonstrated an understanding of the instructions. The patient was advised to call back or seek an in-person evaluation if the symptoms worsen or if the condition fails to improve as anticipated.  Follow up plan: No follow-ups on file.  Ria Bush, MD

## 2019-04-04 ENCOUNTER — Ambulatory Visit: Payer: BC Managed Care – PPO | Admitting: Family Medicine

## 2019-04-04 ENCOUNTER — Encounter: Payer: Self-pay | Admitting: Family Medicine

## 2019-04-04 ENCOUNTER — Other Ambulatory Visit: Payer: Self-pay

## 2019-04-04 DIAGNOSIS — L290 Pruritus ani: Secondary | ICD-10-CM | POA: Insufficient documentation

## 2019-04-04 DIAGNOSIS — K644 Residual hemorrhoidal skin tags: Secondary | ICD-10-CM | POA: Diagnosis not present

## 2019-04-04 MED ORDER — HYDROCORTISONE (PERIANAL) 2.5 % EX CREA: 1.0000 "application " | TOPICAL_CREAM | Freq: Two times a day (BID) | CUTANEOUS | 3 refills | Status: DC

## 2019-04-04 NOTE — Assessment & Plan Note (Addendum)
Nonthrombosed, not currently infected.  Supportive care reviewed as per instructions, handout provided. Rx hydrocortisone cream. rec 1 soft stool/day. Increase water, fiber. Update if not improving with treatment. Pt agrees with plan.

## 2019-04-04 NOTE — Patient Instructions (Addendum)
I do think you have external hemorrhoid - currently not inflammed.  Treat with warm sitz bath, increase water and fiber.  May use hydrocortisone cream as needed.  Increase fiber and water in diet, consider fiber supplement like metamucil or benefiber. Goal is 1 soft stool a day.  Let us know if not improving with this.   Hemorrhoids Hemorrhoids are swollen veins in and around the rectum or anus. There are two types of hemorrhoids:  Internal hemorrhoids. These occur in the veins that are just inside the rectum. They may poke through to the outside and become irritated and painful.  External hemorrhoids. These occur in the veins that are outside the anus and can be felt as a painful swelling or hard lump near the anus. Most hemorrhoids do not cause serious problems, and they can be managed with home treatments such as diet and lifestyle changes. If home treatments do not help the symptoms, procedures can be done to shrink or remove the hemorrhoids. What are the causes? This condition is caused by increased pressure in the anal area. This pressure may result from various things, including:  Constipation.  Straining to have a bowel movement.  Diarrhea.  Pregnancy.  Obesity.  Sitting for long periods of time.  Heavy lifting or other activity that causes you to strain.  Anal sex.  Riding a bike for a long period of time. What are the signs or symptoms? Symptoms of this condition include:  Pain.  Anal itching or irritation.  Rectal bleeding.  Leakage of stool (feces).  Anal swelling.  One or more lumps around the anus. How is this diagnosed? This condition can often be diagnosed through a visual exam. Other exams or tests may also be done, such as:  An exam that involves feeling the rectal area with a gloved hand (digital rectal exam).  An exam of the anal canal that is done using a small tube (anoscope).  A blood test, if you have lost a significant amount of  blood.  A test to look inside the colon using a flexible tube with a camera on the end (sigmoidoscopy or colonoscopy). How is this treated? This condition can usually be treated at home. However, various procedures may be done if dietary changes, lifestyle changes, and other home treatments do not help your symptoms. These procedures can help make the hemorrhoids smaller or remove them completely. Some of these procedures involve surgery, and others do not. Common procedures include:  Rubber band ligation. Rubber bands are placed at the base of the hemorrhoids to cut off their blood supply.  Sclerotherapy. Medicine is injected into the hemorrhoids to shrink them.  Infrared coagulation. A type of light energy is used to get rid of the hemorrhoids.  Hemorrhoidectomy surgery. The hemorrhoids are surgically removed, and the veins that supply them are tied off.  Stapled hemorrhoidopexy surgery. The surgeon staples the base of the hemorrhoid to the rectal wall. Follow these instructions at home: Eating and drinking   Eat foods that have a lot of fiber in them, such as whole grains, beans, nuts, fruits, and vegetables.  Ask your health care provider about taking products that have added fiber (fiber supplements).  Reduce the amount of fat in your diet. You can do this by eating low-fat dairy products, eating less red meat, and avoiding processed foods.  Drink enough fluid to keep your urine pale yellow. Managing pain and swelling   Take warm sitz baths for 20 minutes, 3-4 times a day  to ease pain and discomfort. You may do this in a bathtub or using a portable sitz bath that fits over the toilet.  If directed, apply ice to the affected area. Using ice packs between sitz baths may be helpful. ? Put ice in a plastic bag. ? Place a towel between your skin and the bag. ? Leave the ice on for 20 minutes, 2-3 times a day. General instructions  Take over-the-counter and prescription medicines  only as told by your health care provider.  Use medicated creams or suppositories as told.  Get regular exercise. Ask your health care provider how much and what kind of exercise is best for you. In general, you should do moderate exercise for at least 30 minutes on most days of the week (150 minutes each week). This can include activities such as walking, biking, or yoga.  Go to the bathroom when you have the urge to have a bowel movement. Do not wait.  Avoid straining to have bowel movements.  Keep the anal area dry and clean. Use wet toilet paper or moist towelettes after a bowel movement.  Do not sit on the toilet for long periods of time. This increases blood pooling and pain.  Keep all follow-up visits as told by your health care provider. This is important. Contact a health care provider if you have:  Increasing pain and swelling that are not controlled by treatment or medicine.  Difficulty having a bowel movement, or you are unable to have a bowel movement.  Pain or inflammation outside the area of the hemorrhoids. Get help right away if you have:  Uncontrolled bleeding from your rectum. Summary  Hemorrhoids are swollen veins in and around the rectum or anus.  Most hemorrhoids can be managed with home treatments such as diet and lifestyle changes.  Taking warm sitz baths can help ease pain and discomfort.  In severe cases, procedures or surgery can be done to shrink or remove the hemorrhoids. This information is not intended to replace advice given to you by your health care provider. Make sure you discuss any questions you have with your health care provider. Document Revised: 08/13/2018 Document Reviewed: 08/06/2017 Elsevier Patient Education  [place].

## 2019-04-04 NOTE — Progress Notes (Signed)
This visit was conducted in person.  BP 140/90 (BP Location: Right Arm, Cuff Size: Normal)   Pulse 93   Temp 98 F (36.7 C) (Skin)   Ht 5' 9.25" (1.759 m)   Wt 146 lb 9.6 oz (66.5 kg)   SpO2 94%   BMI 21.49 kg/m    BP Readings from Last 3 Encounters:  04/04/19 140/90  03/15/18 120/78  04/24/17 118/62   CC: hemorrhoids Subjective:    Patient ID: Albert Salas, male    DOB: 13-Dec-1995, 24 y.o.   MRN: [id number]  HPI: Albert Salas is a 24 y.o. male presenting on 04/04/2019 for Hemorrhoids   Started new job with a lot of walking - currently on christmas break from college.   Notes when having to walk a lot can start having trouble on bottom. Describes painful swelling at anus. Initially with rash that has since resolved. Minimal bleeding. Normally 1-2 BM/day, soft.  Has tried prep H, neosporin, desitin without resolution. Finds clobetasol 0.5% cream helps the best.  Feels he drinks water very well.   Has had this issue for years.   Did have boil on bottom about 4 yrs ago - thinks this is when issue started.       Relevant past medical, surgical, family and social history reviewed and updated as indicated. Interim medical history since our last visit reviewed. Allergies and medications reviewed and updated. Outpatient Medications Prior to Visit  Medication Sig Dispense Refill  . albuterol (PROAIR HFA) 108 (90 Base) MCG/ACT inhaler Inhale 2 puffs into the lungs every 6 (six) hours as needed for wheezing or shortness of breath. 18 g 3  . amphetamine-dextroamphetamine (ADDERALL) 10 MG tablet Take 1 tablet (10 mg total) by mouth daily as needed (attention). 30 tablet 0  . cyclobenzaprine (FLEXERIL) 5 MG tablet Take 1-2 tablets (5-10 mg total) by mouth 2 (two) times daily as needed (headache - with sedation precautions). 25 tablet 0   No facility-administered medications prior to visit.     Per HPI unless specifically indicated in ROS section below Review of  Systems Objective:    BP 140/90 (BP Location: Right Arm, Cuff Size: Normal)   Pulse 93   Temp 98 F (36.7 C) (Skin)   Ht 5' 9.25" (1.759 m)   Wt 146 lb 9.6 oz (66.5 kg)   SpO2 94%   BMI 21.49 kg/m   Wt Readings from Last 3 Encounters:  04/04/19 146 lb 9.6 oz (66.5 kg)  10/04/18 135 lb (61.2 kg)  03/15/18 139 lb 8 oz (63.3 kg)    Physical Exam Vitals and nursing note reviewed.  Constitutional:      Appearance: Normal appearance. He is not ill-appearing.  Genitourinary:    Rectum: Tenderness and external hemorrhoid (mild noninflamed R sided) present. No anal fissure.  Neurological:     Mental Status: He is alert.  Psychiatric:        Mood and Affect: Mood normal.        Behavior: Behavior normal.       Assessment & Plan:  This visit occurred during the SARS-CoV-2 public health emergency.  Safety protocols were in place, including screening questions prior to the visit, additional usage of staff PPE, and extensive cleaning of exam room while observing appropriate contact time as indicated for disinfecting solutions.   Problem List Items Addressed This Visit    External hemorrhoid    Nonthrombosed, not currently infected.  Supportive care reviewed as per instructions, handout provided.  Rx hydrocortisone cream. rec 1 soft stool/day. Increase water, fiber. Update if not improving with treatment. Pt agrees with plan.           Meds ordered this encounter  Medications  . hydrocortisone (ANUSOL-HC) 2.5 % rectal cream    Sig: Place 1 application rectally 2 (two) times daily.    Dispense:  30 g    Refill:  3   No orders of the defined types were placed in this encounter.   Patient instructions: I do think you have external hemorrhoid - currently not inflammed.  Treat with warm sitz bath, increase water and fiber.  May use hydrocortisone cream as needed.  Increase fiber and water in diet, consider fiber supplement like metamucil or benefiber. Goal is 1 soft stool a day.   Let us know if not improving with this.   Follow up plan: Return if symptoms worsen or fail to improve.  Ria Bush, MD

## 2019-04-21 ENCOUNTER — Other Ambulatory Visit: Payer: Self-pay | Admitting: Family Medicine

## 2019-04-21 NOTE — Telephone Encounter (Signed)
Name of Medication: Adderall 10 mg  Name of [facility] [place]  Last Fill or Written Date and Quantity:#30 on 10/04/2018 Last Office Visit and Type: 04/04/19 acute and  10/04/18 ADD Next Office Visit and Type: 04/22/2019 hypertension Last Controlled Substance Agreement Date: none seen Last [id number] seen  pt is out of med.

## 2019-04-21 NOTE — Telephone Encounter (Signed)
Patient is requesting a refill  Adderall   Patient is completely out of medication    [facility] [address]

## 2019-04-22 ENCOUNTER — Encounter: Payer: Self-pay | Admitting: Family Medicine

## 2019-04-22 ENCOUNTER — Ambulatory Visit (INDEPENDENT_AMBULATORY_CARE_PROVIDER_SITE_OTHER): Payer: BC Managed Care – PPO | Admitting: Family Medicine

## 2019-04-22 ENCOUNTER — Other Ambulatory Visit: Payer: Self-pay

## 2019-04-22 DIAGNOSIS — R030 Elevated blood-pressure reading, without diagnosis of hypertension: Secondary | ICD-10-CM | POA: Insufficient documentation

## 2019-04-22 MED ORDER — AMPHETAMINE-DEXTROAMPHETAMINE 10 MG PO TABS: 10.0000 mg | ORAL_TABLET | Freq: Every day | ORAL | 0 refills | Status: DC | PRN

## 2019-04-22 NOTE — Telephone Encounter (Signed)
ERx 

## 2019-04-22 NOTE — Progress Notes (Signed)
Virtual visit completed through [employer]. Due to national recommendations of social distancing due to COVID-19, a virtual visit is felt to be most appropriate for this Salas at this time. Reviewed limitations of a virtual visit.   Salas location: home Provider location: [place] [facility], office If any vitals were documented, they were collected by Salas at home unless specified below.    BP 114/79   Ht 5' 9.25" (1.759 m)   Wt 145 lb (65.8 kg)   BMI 21.26 kg/m   123/86 on repeat  CC: HTN f/u Subjective:    Salas ID: Albert Salas, male    DOB: 14-Nov-1995, 24 y.o.   MRN: [id number]  HPI: Albert Salas is a 24 y.o. male presenting on 04/22/2019 for Follow-up (F/u on elevated BP at last OV.  Pt reports BP has return to normal.)   See prior note for details.  Seen here earlier this month with concerns over external hemorrhoid - steroid cream significantly helped but notes occasional trouble especially depending on what pants he wears or if prolonged walking.  At that time had elevated blood pressure so I asked him to start monitoring at home with office recheck in a few weeks - he states home readings were staying elevated when he would come home (150/90s). However since he's returned to school notes improved blood pressures. Increased stress at home and with job. Improved stress levels when he's at college (living in apartment with room mate). Attending university in [place], [place].   No vision changes, CP/tightness, SOB, leg swelling.       Relevant past medical, surgical, family and social history reviewed and updated as indicated. Interim medical history since our last visit reviewed. Allergies and medications reviewed and updated. Outpatient Medications Prior to Visit  Medication Sig Dispense Refill  . albuterol (PROAIR HFA) 108 (90 Base) MCG/ACT inhaler Inhale 2 puffs into the lungs every 6 (six) hours as needed for wheezing or shortness of breath. 18 g 3  .  amphetamine-dextroamphetamine (ADDERALL) 10 MG tablet Take 1 tablet (10 mg total) by mouth daily as needed (attention). 30 tablet 0  . cyclobenzaprine (FLEXERIL) 5 MG tablet Take 1-2 tablets (5-10 mg total) by mouth 2 (two) times daily as needed (headache - with sedation precautions). 25 tablet 0  . hydrocortisone (ANUSOL-HC) 2.5 % rectal cream Place 1 application rectally 2 (two) times daily. 30 g 3   No facility-administered medications prior to visit.     Per HPI unless specifically indicated in ROS section below Review of Systems Objective:    BP 114/79   Ht 5' 9.25" (1.759 m)   Wt 145 lb (65.8 kg)   BMI 21.26 kg/m   Wt Readings from Last 3 Encounters:  04/22/19 145 lb (65.8 kg)  04/04/19 146 lb 9.6 oz (66.5 kg)  10/04/18 135 lb (61.2 kg)     Physical exam: Gen: alert, NAD, not ill appearing Pulm: speaks in complete sentences without increased work of breathing Psych: normal mood, normal thought content      Assessment & Plan:   Problem List Items Addressed This Visit    Elevated blood pressure reading without diagnosis of hypertension    BP elevation has largely normalized since he's returned to college - attributing to higher stressors while at home and with job over [place] break. Reviewed low salt high potassium diet to help control blood pressures, no need for medication at this time. Continue monitoring at home and let me know if consistently >  140/90.           No orders of the defined types were placed in this encounter.  No orders of the defined types were placed in this encounter.   I discussed the assessment and treatment plan with the Salas. The Salas was provided an opportunity to ask questions and all were answered. The Salas agreed with the plan and demonstrated an understanding of the instructions. The Salas was advised to call back or seek an in-person evaluation if the symptoms worsen or if the condition fails to improve as  anticipated.  Follow up plan: Return if symptoms worsen or fail to improve.  Ria Bush, MD

## 2019-04-22 NOTE — Assessment & Plan Note (Signed)
BP elevation has largely normalized since he's returned to college - attributing to higher stressors while at home and with job over [place] break. Reviewed low salt high potassium diet to help control blood pressures, no need for medication at this time. Continue monitoring at home and let me know if consistently >140/90.

## 2019-05-20 ENCOUNTER — Ambulatory Visit: Payer: BC Managed Care – PPO | Admitting: Family Medicine

## 2019-05-27 ENCOUNTER — Ambulatory Visit: Payer: BC Managed Care – PPO | Admitting: Family Medicine

## 2019-05-30 ENCOUNTER — Telehealth: Payer: Self-pay | Admitting: Family Medicine

## 2019-05-30 DIAGNOSIS — K644 Residual hemorrhoidal skin tags: Secondary | ICD-10-CM

## 2019-05-30 NOTE — Telephone Encounter (Signed)
Patient is calling to request a referral He stated that he is wanting to see a [place] doctor for a continent GI problem that you have been helping him with. HE stated that since he is away at school he would like to see someone close to him  [place]

## 2019-05-30 NOTE — Telephone Encounter (Signed)
Referral placed. May send last 2 notes

## 2019-06-01 NOTE — Telephone Encounter (Signed)
Referral sent to [facility] in [place], left message for patient that Referral will be sent to them.

## 2019-06-10 ENCOUNTER — Other Ambulatory Visit: Payer: Self-pay | Admitting: Family Medicine

## 2019-06-10 NOTE — Telephone Encounter (Signed)
Name of Medication: Adderall 10 mg  [facility] [address] [place] or Written Date and Quantity: # 30 on 04/22/19 Last Office Visit and Type: 04/22/19 FU Next Office Visit and Type: none scheduled  Pt is out of medication.

## 2019-06-10 NOTE — Telephone Encounter (Signed)
Patient is requesting a refill   Adderall   Patient is completely out of medication    [facility] [address] [place]

## 2019-06-13 MED ORDER — AMPHETAMINE-DEXTROAMPHETAMINE 10 MG PO TABS: 10.0000 mg | ORAL_TABLET | Freq: Every day | ORAL | 0 refills | Status: DC | PRN

## 2019-06-13 NOTE — Telephone Encounter (Signed)
ERx 

## 2019-06-17 ENCOUNTER — Telehealth: Payer: Self-pay | Admitting: Family Medicine

## 2019-06-17 NOTE — Telephone Encounter (Signed)
Albert Salas  @ [facility]  called needing referral /office notes/ demographics on pt He called there to schedule an appointment for hemroids They cannot  Schedule until they get this information   Fax [phone]

## 2019-06-17 NOTE — Telephone Encounter (Signed)
plz touch base with their office - they previously declined his referral and had recommended surgery referral. See prior phone note.

## 2019-06-20 NOTE — Telephone Encounter (Signed)
Called office and left message. No longer need this referral, their office would not see patient because his hemorroid was external not internal. Patient was referral to a Archivist instead.

## 2019-06-30 ENCOUNTER — Other Ambulatory Visit: Payer: Self-pay

## 2019-06-30 ENCOUNTER — Encounter: Payer: Self-pay | Admitting: Family Medicine

## 2019-06-30 ENCOUNTER — Ambulatory Visit: Payer: BC Managed Care – PPO | Admitting: Family Medicine

## 2019-06-30 VITALS — BP 118/60 | HR 67 | Temp 97.6°F | Ht 69.25 in | Wt 150.4 lb

## 2019-06-30 DIAGNOSIS — L290 Pruritus ani: Secondary | ICD-10-CM | POA: Diagnosis not present

## 2019-06-30 DIAGNOSIS — F418 Other specified anxiety disorders: Secondary | ICD-10-CM

## 2019-06-30 MED ORDER — HYDROCORTISONE (PERIANAL) 2.5 % EX CREA: 1.0000 "application " | TOPICAL_CREAM | Freq: Two times a day (BID) | CUTANEOUS | 3 refills | Status: DC

## 2019-06-30 MED ORDER — HYDROCORTISONE ACETATE 25 MG RE SUPP: 25.0000 mg | Freq: Two times a day (BID) | RECTAL | 0 refills | Status: DC

## 2019-06-30 NOTE — Progress Notes (Signed)
This visit was conducted in person.  BP 118/60 (BP Location: Left Arm, Patient Position: Sitting, Cuff Size: Normal)   Pulse 67   Temp 97.6 F (36.4 C) (Temporal)   Ht 5' 9.25" (1.759 m)   Wt 150 lb 7 oz (68.2 kg)   SpO2 98%   BMI 22.06 kg/m    CC: check hemorrhoids Subjective:    Patient ID: Albert Salas, male    DOB: [birth year], 24 y.o.   MRN: [id number]  HPI: Albert Salas is a 24 y.o. male presenting on 06/30/2019 for Hemorrhoids (Here for f/u.)   See prior notes for details.  Saw [facility] in [place] - overall benign exam ?fissure. Discussed increased water and fiber in diet, rec colorectal specialist if not improving.   He has started using benefiber, fiber cereal and fiber bar. Drinks plenty of water.   Ongoing for several years. Increased perianal discomfort noted when walking a lot. Describes perianal swelling. Initially had rash that has since resolved. Has tried prep H, neosporin, desitin with limited improvement. Found clobetasol 0.5% cream helped the best. Normally has 1 BM/day, soft without straining. No bleeding.   Wears cotton boxers. Finds wearing jeans in the heat and prolonged walking worsens irritation.  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   Pain > itching. When flared, more discomfort middle to end of day. Treats with prep H or steroid cream at night with benefit.  Last irritation was 1.5 wks ago.   Requests new letter for emotional support animal - he has a dog that helps him with depression/anxiety. 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      Relevant past medical, surgical, family and social history reviewed and updated as indicated. Interim medical history since our last visit reviewed. Allergies and medications reviewed and updated. Outpatient Medications Prior to Visit  Medication Sig Dispense Refill  . albuterol (PROAIR HFA) 108 (90 Base) MCG/ACT inhaler  Inhale 2 puffs into the lungs every 6 (six) hours as needed for wheezing or shortness of breath. 18 g 3  . amphetamine-dextroamphetamine (ADDERALL) 10 MG tablet Take 1 tablet (10 mg total) by mouth daily as needed (attention). 30 tablet 0  . cyclobenzaprine (FLEXERIL) 5 MG tablet Take 1-2 tablets (5-10 mg total) by mouth 2 (two) times daily as needed (headache - with sedation precautions). 25 tablet 0  . hydrocortisone (ANUSOL-HC) 2.5 % rectal cream Place 1 application rectally 2 (two) times daily. 30 g 3   No facility-administered medications prior to visit.     Per HPI unless specifically indicated in ROS section below Review of Systems Objective:    BP 118/60 (BP Location: Left Arm, Patient Position: Sitting, Cuff Size: Normal)   Pulse 67   Temp 97.6 F (36.4 C) (Temporal)   Ht 5' 9.25" (1.759 m)   Wt 150 lb 7 oz (68.2 kg)   SpO2 98%   BMI 22.06 kg/m   Wt Readings from Last 3 Encounters:  06/30/19 150 lb 7 oz (68.2 kg)  04/22/19 145 lb (65.8 kg)  04/04/19 146 lb 9.6 oz (66.5 kg)    Physical Exam Vitals and nursing note reviewed.  Constitutional:      Appearance: Normal appearance. He is not ill-appearing.  Genitourinary:    Rectum: Normal. No mass, tenderness, anal fissure, external hemorrhoid or internal hemorrhoid. Normal anal tone.     Comments:  No obvious external or prolapsed  internal hemohrroids. Mild erythema/swelling R posterior skin near anus but no obvious hemorrhoid  Neurological:     Mental Status: He is alert.  Psychiatric:        Mood and Affect: Mood normal.        Behavior: Behavior normal.       Assessment & Plan:  This visit occurred during the SARS-CoV-2 public health emergency.  Safety protocols were in place, including screening questions prior to the visit, additional usage of staff PPE, and extensive cleaning of exam room while observing appropriate contact time as indicated for disinfecting solutions.   Problem List Items Addressed This Visit      Perianal irritation - Primary    Story/exam most consistent with irritation of perianal skin worse with anything that decreases ventilation to the area including sweating/wearing jeans. Will treat with anusol cream, reasonable to trial steroid suppository. Discussed steroid precautions. He has already implemented healthy diet for goal 1 soft BM/day. Update if worsening despite this - would refer to GI to consider anoscopy. Pt agrees with plan.       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 Letter of support for service animal provided today for new apartment.           Meds ordered this encounter  Medications  . hydrocortisone (ANUSOL-HC) 2.5 % rectal cream    Sig: Place 1 application rectally 2 (two) times daily.    Dispense:  30 g    Refill:  3  . hydrocortisone (ANUSOL-HC) 25 MG suppository    Sig: Place 1 suppository (25 mg total) rectally 2 (two) times daily.    Dispense:  12 suppository    Refill:  0   No orders of the defined types were placed in this encounter.   Patient Instructions  Exam overall ok. May continue steroid cream, may try suppository as needed.  Could try over the counter cortizone-10 (weaker steroid).  Continue fiber and water in the diet. Let us know if worsening for GI referral.    Follow up plan: Return if symptoms worsen or fail to improve.  Ria Bush, MD

## 2019-06-30 NOTE — Patient Instructions (Addendum)
Exam overall ok. May continue steroid cream, may try suppository as needed.  Could try over the counter cortizone-10 (weaker steroid).  Continue fiber and water in the diet. Let us know if worsening for GI referral.

## 2019-07-02 NOTE — Assessment & Plan Note (Signed)
Story/exam most consistent with irritation of perianal skin worse with anything that decreases ventilation to the area including sweating/wearing jeans. Will treat with anusol cream, reasonable to trial steroid suppository. Discussed steroid precautions. He has already implemented healthy diet for goal 1 soft BM/day. Update if worsening despite this - would refer to GI to consider anoscopy. Pt agrees with plan.

## 2019-07-02 NOTE — Assessment & Plan Note (Addendum)
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 Letter of support for service animal provided today for new apartment.

## 2019-07-27 ENCOUNTER — Telehealth: Payer: Self-pay | Admitting: Family Medicine

## 2019-07-27 DIAGNOSIS — L290 Pruritus ani: Secondary | ICD-10-CM

## 2019-07-27 NOTE — Telephone Encounter (Signed)
referral placed

## 2019-07-27 NOTE — Telephone Encounter (Signed)
Pt says he is still having the same problem as last month and needs a referral to a GI doctor not a Psychologist, sport and exercise.

## 2019-07-27 NOTE — Addendum Note (Signed)
Addended by: Ria Bush on: 07/27/2019 05:50 PM   Modules accepted: Orders

## 2019-07-28 ENCOUNTER — Encounter: Payer: Self-pay | Admitting: Gastroenterology

## 2019-07-28 ENCOUNTER — Other Ambulatory Visit: Payer: Self-pay | Admitting: Family Medicine

## 2019-07-28 NOTE — Addendum Note (Signed)
Addended by: Pilar Grammes on: 07/28/2019 04:46 PM   Modules accepted: Orders

## 2019-07-28 NOTE — Telephone Encounter (Signed)
Pt needs refill for [facility]

## 2019-07-28 NOTE — Telephone Encounter (Signed)
Last written 06-13-19 #30 Last OV for ADHD 10-04-18 (several other OVs since) No Future OV [facility]

## 2019-07-29 MED ORDER — AMPHETAMINE-DEXTROAMPHETAMINE 10 MG PO TABS: 10.0000 mg | ORAL_TABLET | Freq: Every day | ORAL | 0 refills | Status: DC | PRN

## 2019-07-29 NOTE — Telephone Encounter (Signed)
ERx 

## 2019-08-16 ENCOUNTER — Encounter: Payer: Self-pay | Admitting: Gastroenterology

## 2019-08-16 ENCOUNTER — Ambulatory Visit (INDEPENDENT_AMBULATORY_CARE_PROVIDER_SITE_OTHER): Payer: BC Managed Care – PPO | Admitting: Gastroenterology

## 2019-08-16 ENCOUNTER — Other Ambulatory Visit (INDEPENDENT_AMBULATORY_CARE_PROVIDER_SITE_OTHER): Payer: BC Managed Care – PPO

## 2019-08-16 VITALS — BP 112/90 | HR 88 | Ht 70.0 in | Wt 139.1 lb

## 2019-08-16 DIAGNOSIS — R197 Diarrhea, unspecified: Secondary | ICD-10-CM | POA: Insufficient documentation

## 2019-08-16 DIAGNOSIS — K6289 Other specified diseases of anus and rectum: Secondary | ICD-10-CM

## 2019-08-16 DIAGNOSIS — R102 Pelvic and perineal pain: Secondary | ICD-10-CM | POA: Insufficient documentation

## 2019-08-16 LAB — CBC WITH DIFFERENTIAL/PLATELET
Basophils Absolute: 0 10*3/uL (ref 0.0–0.1)
Basophils Relative: 0.9 % (ref 0.0–3.0)
Eosinophils Absolute: 0.1 10*3/uL (ref 0.0–0.7)
Eosinophils Relative: 1 % (ref 0.0–5.0)
HCT: 49.5 % (ref 39.0–52.0)
Hemoglobin: 16.4 g/dL (ref 13.0–17.0)
Lymphocytes Relative: 18.5 % (ref 12.0–46.0)
Lymphs Abs: 0.9 10*3/uL (ref 0.7–4.0)
MCHC: 33.1 g/dL (ref 30.0–36.0)
MCV: 91.2 fl (ref 78.0–100.0)
Monocytes Absolute: 0.6 10*3/uL (ref 0.1–1.0)
Monocytes Relative: 12.8 % — ABNORMAL HIGH (ref 3.0–12.0)
Neutro Abs: 3.3 10*3/uL (ref 1.4–7.7)
Neutrophils Relative %: 66.8 % (ref 43.0–77.0)
Platelets: 198 10*3/uL (ref 150.0–400.0)
RBC: 5.42 Mil/uL (ref 4.22–5.81)
RDW: 12.7 % (ref 11.5–15.5)
WBC: 5 10*3/uL (ref 4.0–10.5)

## 2019-08-16 LAB — COMPREHENSIVE METABOLIC PANEL
ALT: 15 U/L (ref 0–53)
AST: 18 U/L (ref 0–37)
Albumin: 4.7 g/dL (ref 3.5–5.2)
Alkaline Phosphatase: 49 U/L (ref 39–117)
BUN: 12 mg/dL (ref 6–23)
CO2: 32 mEq/L (ref 19–32)
Calcium: 9.5 mg/dL (ref 8.4–10.5)
Chloride: 103 mEq/L (ref 96–112)
Creatinine, Ser: 0.92 mg/dL (ref 0.40–1.50)
GFR: 101.67 mL/min (ref >60.00)
Glucose, Bld: 100 mg/dL — ABNORMAL HIGH (ref 70–99)
Potassium: 4.2 mEq/L (ref 3.5–5.1)
Sodium: 138 mEq/L (ref 135–145)
Total Bilirubin: 1.4 mg/dL — ABNORMAL HIGH (ref 0.2–1.2)
Total Protein: 7.4 g/dL (ref 6.0–8.3)

## 2019-08-16 LAB — SEDIMENTATION RATE: Sed Rate: 1 mm/hr (ref 0–15)

## 2019-08-16 LAB — C-REACTIVE PROTEIN: CRP: <1 mg/dL (ref 0.5–20.0)

## 2019-08-16 MED ORDER — SUTAB 1479-225-188 MG PO TABS: 1.0000 | ORAL_TABLET | Freq: Once | ORAL | 0 refills | Status: AC

## 2019-08-16 NOTE — Progress Notes (Signed)
08/16/2019 Albert Salas [id number] 15-Mar-1996   HISTORY OF PRESENT ILLNESS: This is a 24 year old male who comes in today at the request of his PCP, Dr. Danise Mina, for evaluation regarding rectal/perineal pain. He tells me this has been present for the past year or so. Says that he has chronic discomfort, but it gets really red and swollen feeling after working all day. 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 He has tried a lot of topical regimens including Desitin, hydrocortisone, etc. He says that it is very painful after working all day. Says the more that he walks the more that it hurts. He then also reports intermittent diarrhea. Says that he diagnosed himself with lactose intolerance. He says that he now tries to avoid dairy products. When he gets diarrhea he does get really bad abdominal cramping. He also describes sensation of needing to have a bowel movement/diarrhea, but then only passes a small amount of stool. He says that he is only seen a very small amount of blood on one occasion. So far no evaluation has been performed.  Unfortunately this is a tough situation. He is moving to [place] in 2 days. He would like to try to proceed with evaluation here, however, since it may take him some time to get into a gastro office in [place]. He will just commute home for a long weekend in order to have colonoscopy procedure.   Past Medical History:  Diagnosis Date  . ADHD (attention deficit hyperactivity disorder) 2008   dx in 4th grade, s/p psychological testing (no records), prior tried intuniv and vyvanse  . Asthma   . Depression with anxiety   . Headache   . Heartburn   . History of chicken pox    Past Surgical History:  Procedure Laterality Date  . NO PAST SURGERIES      reports that he has been smoking. He has never used smokeless tobacco. He reports current alcohol use. He reports current drug use. Drug: Marijuana. family history  includes Cancer in an other family member; Colon cancer in his maternal grandmother; Depression in his mother; Diabetes in his father, paternal grandfather, and paternal uncle; Heart disease in his paternal uncle; Irritable bowel syndrome in his sister; Leukemia in his maternal grandmother; Lung cancer in his maternal grandfather. No Known Allergies    Outpatient Encounter Medications as of 08/16/2019  Medication Sig  . amphetamine-dextroamphetamine (ADDERALL) 10 MG tablet Take 1 tablet (10 mg total) by mouth daily as needed (attention).  . hydrocortisone (ANUSOL-HC) 2.5 % rectal cream Place 1 application rectally 2 (two) times daily.  Marland Kitchen albuterol (PROAIR HFA) 108 (90 Base) MCG/ACT inhaler Inhale 2 puffs into the lungs every 6 (six) hours as needed for wheezing or shortness of breath. (Patient not taking: Reported on 08/16/2019)  . [DISCONTINUED] cyclobenzaprine (FLEXERIL) 5 MG tablet Take 1-2 tablets (5-10 mg total) by mouth 2 (two) times daily as needed (headache - with sedation precautions).  . [DISCONTINUED] hydrocortisone (ANUSOL-HC) 25 MG suppository Place 1 suppository (25 mg total) rectally 2 (two) times daily.   No facility-administered encounter medications on file as of 08/16/2019.    REVIEW OF SYSTEMS  : All other systems reviewed and negative except where noted in the History of Present Illness.  PHYSICAL EXAM: Ht 5\' 10"  (1.778 m) Comment: height measured without shoes  Wt 139 lb 2 oz (63.1 kg)   BMI 19.96 kg/m  General: Well developed white male in no acute  distress Head: Normocephalic and atraumatic Eyes:  Sclerae anicteric, conjunctiva pink. Ears: Normal auditory acuity Lungs: Clear throughout to auscultation; no increased WOB. Heart: Regular rate and rhythm; no M/R/G. Abdomen: Soft, non-distended.  BS present.  Non-tender. Rectal: No external abnormalities were noted in the perianal or perineum area. DRE was performed and was nonpainful. No masses were felt, but there was a  moderate amount of stool in the rectum. Anoscopy not attempted. Stool on exam glove was light brown, not hemocculted. Musculoskeletal: Symmetrical with no gross deformities  Skin: No lesions on visible extremities Extremities: No edema  Neurological: Alert oriented x 4, grossly non-focal Psychological:  Alert and cooperative. Normal mood and affect  ASSESSMENT AND PLAN: *24 year old male with complaints of perineal/rectal pain x 1 year: Describes it as being extremely sore after working all day. Says it gets really red and irritated. Has used a bunch of topical treatments. On exam today I did not find any cause for his symptoms and there was no rash or irritation, not really painful on exam. Not sure if this is all just due to chafing since he works in a hot environment and is constantly walking and doing physical labor. He does also describe loose stools as well as what sounds like tenesmus, however. Due to these complaints I think that he warrants a colonoscopy to evaluate for colitis/proctitis. Anoscopy was not attempted today because he had a moderate amount of stool in his rectum. We will also check CBC, CMP, TSH, sed rate, and CRP.  Will plan for colonoscopy with Dr. Tarri Glenn.  The risks, benefits, and alternatives to colonoscopy were discussed with the patient and he consents to proceed.   CC:  Ria Bush, MD

## 2019-08-16 NOTE — Progress Notes (Signed)
Reviewed and agree with management plans. ? ?Ryett Hamman L. Sharia Averitt, MD, MPH  ?

## 2019-08-16 NOTE — Patient Instructions (Addendum)
If you are age 23 or older, your body mass index should be between 23-30. Your Body mass index is 19.96 kg/m. If this is out of the aforementioned range listed, please consider follow up with your Primary Care Provider.  If you are age 41 or younger, your body mass index should be between 19-25. Your Body mass index is 19.96 kg/m. If this is out of the aformentioned range listed, please consider follow up with your Primary Care Provider.   Your provider has requested that you go to the basement level for lab work before leaving today. Press "B" on the elevator. The lab is located at the first door on the left as you exit the elevator.  You have been scheduled for a colonoscopy. Please follow written instructions given to you at your visit today.  Please pick up your prep supplies at the pharmacy within the next 1-3 days. If you use inhalers (even only as needed), please bring them with you on the day of your procedure.   Due to recent changes in healthcare laws, you may see the results of your imaging and laboratory studies on MyChart before your provider has had a chance to review them.  We understand that in some cases there may be results that are confusing or concerning to you. Not all laboratory results come back in the same time frame and the provider may be waiting for multiple results in order to interpret others.  Please give [place] 48 hours in order for your provider to thoroughly review all the results before contacting the office for clarification of your results.   Follow up in clinic per recommendations from Dr. Tarri Glenn after procedure.

## 2019-08-17 LAB — TSH: TSH: 0.87 u[IU]/mL (ref 0.35–4.50)

## 2019-08-30 HISTORY — PX: COLONOSCOPY: SHX174

## 2019-09-14 ENCOUNTER — Encounter: Payer: Self-pay | Admitting: Gastroenterology

## 2019-09-23 ENCOUNTER — Ambulatory Visit (AMBULATORY_SURGERY_CENTER): Payer: BC Managed Care – PPO | Admitting: Gastroenterology

## 2019-09-23 ENCOUNTER — Encounter: Payer: Self-pay | Admitting: Gastroenterology

## 2019-09-23 ENCOUNTER — Other Ambulatory Visit: Payer: Self-pay

## 2019-09-23 VITALS — BP 114/73 | HR 45 | Temp 97.5°F | Resp 11 | Ht 70.0 in | Wt 139.0 lb

## 2019-09-23 DIAGNOSIS — D128 Benign neoplasm of rectum: Secondary | ICD-10-CM

## 2019-09-23 DIAGNOSIS — K621 Rectal polyp: Secondary | ICD-10-CM

## 2019-09-23 DIAGNOSIS — K6289 Other specified diseases of anus and rectum: Secondary | ICD-10-CM | POA: Diagnosis not present

## 2019-09-23 MED ORDER — SODIUM CHLORIDE 0.9 % IV SOLN: 500.0000 mL | Freq: Once | INTRAVENOUS | Status: DC

## 2019-09-23 NOTE — Op Note (Signed)
[place] [facility] Patient Name: Albert Salas Procedure Date: 09/23/2019 2:40 PM MRN: [id number] Endoscopist: Thornton Park MD, MD Age: 24 Referring MD:  Date of Birth: 20-Apr-1995 Gender: Male Account #: [id number] Procedure:                Colonoscopy Indications:              Rectal pain Medicines:                Monitored Anesthesia Care Procedure:                Pre-Anesthesia Assessment:                           - Prior to the procedure, a History and Physical                            was performed, and patient medications and                            allergies were reviewed. The patient's tolerance of                            previous anesthesia was also reviewed. The risks                            and benefits of the procedure and the sedation                            options and risks were discussed with the patient.                            All questions were answered, and informed consent                            was obtained. Prior Anticoagulants: The patient has                            taken no previous anticoagulant or antiplatelet                            agents. ASA Grade Assessment: II - A patient with                            mild systemic disease. After reviewing the risks                            and benefits, the patient was deemed in                            satisfactory condition to undergo the procedure.                           After obtaining informed consent, the colonoscope  was passed under direct vision. Throughout the                            procedure, the patient's blood pressure, pulse, and                            oxygen saturations were monitored continuously. The                            Colonoscope was introduced through the anus and                            advanced to the 3 cm into the ileum. A second                            forward view of the right colon was perforemed. The                             colonoscopy was performed without difficulty. The                            patient tolerated the procedure well. The quality                            of the bowel preparation was good. The terminal                            ileum, ileocecal valve, appendiceal orifice, and                            rectum were photographed. Scope In: 2:44:01 PM Scope Out: 3:97:67 PM Scope Withdrawal Time: 0 hours 10 minutes 45 seconds  Total Procedure Duration: 0 hours 12 minutes 55 seconds  Findings:                 The perianal and digital rectal examinations were                            normal.                           Anal papilla(e) were hypertrophied.                           A less than 1 mm polyp was found in the rectum. The                            polyp was sessile. The polyp was removed with a                            cold biopsy forceps. Resection and retrieval were                            complete. Estimated blood loss was minimal.  The exam was otherwise without abnormality on                            direct and retroflexion views. Complications:            No immediate complications. Estimated blood loss:                            Minimal. Estimated Blood Loss:     Estimated blood loss was minimal. Impression:               - Anal papilla(e) were hypertrophied.                           - One less than 1 mm polyp in the rectum, removed                            with a cold biopsy forceps. Resected and retrieved.                           - No source for recent symptoms identified on this                            examination. Recommendation:           - Patient has a contact number available for                            emergencies. The signs and symptoms of potential                            delayed complications were discussed with the                            patient. Return to normal activities tomorrow.                             Written discharge instructions were provided to the                            patient.                           - Resume previous diet.                           - Continue present medications.                           - Add a daily stool bulking agent such as psyllium                            or methylcellulose.                           - Await pathology results.                           -  Repeat colonoscopy date to be determined after                            pending pathology results are reviewed for                            surveillance.                           - Return to GI office at the next available                            appointment with J Zehr. Thornton Park MD, MD 09/23/2019 3:04:30 PM This report has been signed electronically.

## 2019-09-23 NOTE — Progress Notes (Signed)
PT taken to PACU. Monitors in place. VSS. Report given to RN. 

## 2019-09-23 NOTE — Patient Instructions (Signed)
Handout given  Polyps Resume previous diet Continue present medications  Add psyllium or methylcellulose daily to assist with bowel movements Await pathology results Please return to GI office at the next available appointment with J Zehr   YOU HAD AN ENDOSCOPIC PROCEDURE TODAY AT [facility]:   Refer to the procedure report that was given to you for any specific questions about what was found during the examination.  If the procedure report does not answer your questions, please call your gastroenterologist to clarify.  If you requested that your care partner not be given the details of your procedure findings, then the procedure report has been included in a sealed envelope for you to review at your convenience later.  YOU SHOULD EXPECT: Some feelings of bloating in the abdomen. Passage of more gas than usual.  Walking can help get rid of the air that was put into your GI tract during the procedure and reduce the bloating. If you had a lower endoscopy (such as a colonoscopy or flexible sigmoidoscopy) you may notice spotting of blood in your stool or on the toilet paper. If you underwent a bowel prep for your procedure, you may not have a normal bowel movement for a few days.  Please Note:  You might notice some irritation and congestion in your nose or some drainage.  This is from the oxygen used during your procedure.  There is no need for concern and it should clear up in a day or so.  SYMPTOMS TO REPORT IMMEDIATELY:   Following lower endoscopy (colonoscopy or flexible sigmoidoscopy):  Excessive amounts of blood in the stool  Significant tenderness or worsening of abdominal pains  Swelling of the abdomen that is new, acute  Fever of 100F or higher   For urgent or emergent issues, a gastroenterologist can be reached at any hour by calling [phone]. Do not use MyChart messaging for urgent concerns.    DIET:  We do recommend a small meal at first, but then you may  proceed to your regular diet.  Drink plenty of fluids but you should avoid alcoholic beverages for 24 hours.  ACTIVITY:  You should plan to take it easy for the rest of today and you should NOT DRIVE or use heavy machinery until tomorrow (because of the sedation medicines used during the test).    FOLLOW UP: Our staff will call the number listed on your records 48-72 hours following your procedure to check on you and address any questions or concerns that you may have regarding the information given to you following your procedure. If we do not reach you, we will leave a message.  We will attempt to reach you two times.  During this call, we will ask if you have developed any symptoms of COVID 19. If you develop any symptoms (ie: fever, flu-like symptoms, shortness of breath, cough etc.) before then, please call [phone].  If you test positive for Covid 19 in the 2 weeks post procedure, please call and report this information to [place].    If any biopsies were taken you will be contacted by phone or by letter within the next 1-3 weeks.  Please call us at [phone] if you have not heard about the biopsies in 3 weeks.    SIGNATURES/CONFIDENTIALITY: You and/or your care partner have signed paperwork which will be entered into your electronic medical record.  These signatures attest to the fact that that the information above on your After Visit Summary has  been reviewed and is understood.  Full responsibility of the confidentiality of this discharge information lies with you and/or your care-partner.

## 2019-09-27 ENCOUNTER — Telehealth: Payer: Self-pay

## 2019-09-27 NOTE — Telephone Encounter (Signed)
°  Follow up Call-  Call back number 09/23/2019  Post procedure Call Back phone  # [phone]  Permission to leave phone message Yes  Some recent data might be hidden     Patient questions:  Do you have a fever, pain , or abdominal swelling? No. Pain Score  0 *  Have you tolerated food without any problems? Yes.    Have you been able to return to your normal activities? Yes.    Do you have any questions about your discharge instructions: Diet   No. Medications  No. Follow up visit  No.  Do you have questions or concerns about your Care? No.  Actions: * If pain score is 4 or above: 1. No action needed, pain <4.Have you developed a fever since your procedure? no  2.   Have you had an respiratory symptoms (SOB or cough) since your procedure? no  3.   Have you tested positive for COVID 19 since your procedure no  4.   Have you had any family members/close contacts diagnosed with the COVID 19 since your procedure?  no   If yes to any of these questions please route to Joylene John, RN and Erenest Rasher, RN

## 2019-09-30 ENCOUNTER — Encounter: Payer: Self-pay | Admitting: Gastroenterology

## 2019-10-19 ENCOUNTER — Other Ambulatory Visit: Payer: Self-pay

## 2019-10-19 NOTE — Telephone Encounter (Signed)
Patient contacted the office needing a refill on his Adderall. Last refilled 07/29/19 for #30 with 0 refills. [facility] [place] [place] Dr. Darnell Level, is this ok to refill?

## 2019-10-20 ENCOUNTER — Ambulatory Visit: Payer: BC Managed Care – PPO | Admitting: Gastroenterology

## 2019-10-20 MED ORDER — AMPHETAMINE-DEXTROAMPHETAMINE 10 MG PO TABS: 10.0000 mg | ORAL_TABLET | Freq: Every day | ORAL | 0 refills | Status: DC | PRN

## 2019-10-20 NOTE — Telephone Encounter (Signed)
Patient called office stating he went to his pharmacy to pick up his refill and they didn't have it. He would like this to get sent to his pharmacy as soon as possible.

## 2019-10-20 NOTE — Telephone Encounter (Signed)
Name of Medication: Adderall Name of Pharmacy: [facility], [place] Last Fill or Written Date and Quantity: 07/29/19, #30 Last Office Visit and Type: 06/30/19, f/u Next Office Visit and Type: none Last Controlled Substance Agreement Date: none Last UDS: none

## 2019-10-20 NOTE — Telephone Encounter (Signed)
plz notify sent in. 

## 2019-10-20 NOTE — Telephone Encounter (Signed)
Left message on vm per dpr notifying pt refill was sent in.  

## 2019-12-09 ENCOUNTER — Other Ambulatory Visit: Payer: Self-pay | Admitting: Family Medicine

## 2019-12-09 MED ORDER — AMPHETAMINE-DEXTROAMPHETAMINE 10 MG PO TABS
10.0000 mg | ORAL_TABLET | Freq: Every day | ORAL | 0 refills | Status: DC | PRN
Start: 2019-12-09 — End: 2020-02-02

## 2019-12-09 NOTE — Telephone Encounter (Signed)
Name of Medication: Adderall Name of Pharmacy: [facility], [place] Last Fill or Written Date and Quantity: 10/20/19, #30 Last Office Visit and Type: 06/30/19, f/u Next Office Visit and Type: none Last Controlled Substance Agreement Date: none Last UDS: none

## 2019-12-09 NOTE — Telephone Encounter (Signed)
Pt called to get a refill  addreall  [facility] [place]  Pt is out of meds

## 2019-12-09 NOTE — Telephone Encounter (Signed)
ERx 

## 2020-01-26 ENCOUNTER — Other Ambulatory Visit: Payer: Self-pay | Admitting: Family Medicine

## 2020-01-26 NOTE — Telephone Encounter (Signed)
Anusol cream Last rx:  06/30/19, #30 g Last OV:  06/30/19, perianal irritation Next OV:  none

## 2020-01-26 NOTE — Telephone Encounter (Signed)
Pt called needing to get refills on  adderall Hydrocortisone  Pt is out of meds  [facility]  [place]

## 2020-01-27 MED ORDER — HYDROCORTISONE (PERIANAL) 2.5 % EX CREA
1.0000 | TOPICAL_CREAM | Freq: Two times a day (BID) | CUTANEOUS | 1 refills | Status: DC
Start: 2020-01-27 — End: 2020-06-29

## 2020-01-27 NOTE — Telephone Encounter (Signed)
ERx 

## 2020-02-02 ENCOUNTER — Other Ambulatory Visit: Payer: Self-pay | Admitting: Family Medicine

## 2020-02-02 MED ORDER — AMPHETAMINE-DEXTROAMPHETAMINE 10 MG PO TABS
10.0000 mg | ORAL_TABLET | Freq: Every day | ORAL | 0 refills | Status: DC | PRN
Start: 2020-02-02 — End: 2020-03-06

## 2020-02-02 NOTE — Telephone Encounter (Signed)
ERx 

## 2020-02-02 NOTE — Telephone Encounter (Signed)
Pt called checking on rx for addreall  [facility] [place]  Pt is out of meds

## 2020-02-02 NOTE — Telephone Encounter (Signed)
This is the 1st notification.  Name of Medication: Adderall Name of Pharmacy: [facility] Last Fill or Written Date and Quantity: 12/09/19, #30 Last Office Visit and Type: 06/30/19, perianal irritation Next Office Visit and Type: none Last Controlled Substance Agreement Date: none Last UDS: none

## 2020-03-06 ENCOUNTER — Other Ambulatory Visit: Payer: Self-pay | Admitting: Family Medicine

## 2020-03-06 MED ORDER — AMPHETAMINE-DEXTROAMPHETAMINE 10 MG PO TABS
10.0000 mg | ORAL_TABLET | Freq: Every day | ORAL | 0 refills | Status: DC | PRN
Start: 2020-03-06 — End: 2020-10-05

## 2020-03-06 NOTE — Addendum Note (Signed)
Addended by: Brenton Grills on: 21/04/9415 40:81 AM   Modules accepted: Orders

## 2020-03-06 NOTE — Telephone Encounter (Signed)
Name of Medication: Adderall Name of Pharmacy: [facility] Last Fill or Written Date and Quantity: 02/02/20, #30 Last Office Visit and Type: 06/30/19, perianal irritation Next Office Visit and Type: 03/19/20, swollen nasal cavity; discuss meds Last Controlled Substance Agreement Date: none Last UDS: none

## 2020-03-06 NOTE — Telephone Encounter (Signed)
ERx 

## 2020-03-06 NOTE — Telephone Encounter (Signed)
Patient states that he is needing a refill on his adderall 10 mg. EM

## 2020-03-19 ENCOUNTER — Ambulatory Visit: Payer: BC Managed Care – PPO | Admitting: Family Medicine

## 2020-03-19 ENCOUNTER — Encounter: Payer: Self-pay | Admitting: Family Medicine

## 2020-03-19 ENCOUNTER — Other Ambulatory Visit: Payer: Self-pay

## 2020-03-19 VITALS — BP 118/80 | HR 74 | Temp 98.3°F | Ht 70.0 in | Wt 140.1 lb

## 2020-03-19 DIAGNOSIS — R0989 Other specified symptoms and signs involving the circulatory and respiratory systems: Secondary | ICD-10-CM | POA: Diagnosis not present

## 2020-03-19 DIAGNOSIS — F418 Other specified anxiety disorders: Secondary | ICD-10-CM | POA: Diagnosis not present

## 2020-03-19 DIAGNOSIS — F988 Other specified behavioral and emotional disorders with onset usually occurring in childhood and adolescence: Secondary | ICD-10-CM

## 2020-03-19 DIAGNOSIS — K6289 Other specified diseases of anus and rectum: Secondary | ICD-10-CM | POA: Diagnosis not present

## 2020-03-19 MED ORDER — METHYLPHENIDATE HCL 5 MG PO TABS: 5.0000 mg | ORAL_TABLET | Freq: Every day | ORAL | 0 refills | Status: DC | PRN

## 2020-03-19 NOTE — Assessment & Plan Note (Signed)
Does well with adderall for ADHD however concern for side effect of anal pain as he only notes it on days he takes thsi stimulant. Will trial different family of stimulant ritalin. Update with effect.

## 2020-03-19 NOTE — Assessment & Plan Note (Signed)
Started after afrin overuse. Ongoing difficulty with nasal mucosal irritation and inflammation. rec start flonase + nasal saline throughout the day. No signs of nasal polyps or ongoing infection.

## 2020-03-19 NOTE — Assessment & Plan Note (Signed)
Ongoing, off medication.  Animal helps him with mood.  Overall stable period.

## 2020-03-19 NOTE — Assessment & Plan Note (Deleted)
Has not responded to conservative management with sitz baths, or anusol cream or suppositories. Has seen gen surgery with reassuring evaluation. Now notes symptoms only come on when he takes adderall - wonders if side effect to medication. See above.

## 2020-03-19 NOTE — Progress Notes (Signed)
Patient ID: Albert Salas, male    DOB: 10-Jan-1996, 24 y.o.   MRN: [id number]  This visit was conducted in person.  BP 118/80 (BP Location: Left Arm, Patient Position: Sitting, Cuff Size: Normal)   Pulse 74   Temp 98.3 F (36.8 C) (Temporal)   Ht 5\' 10"  (1.778 m)   Wt 140 lb 1 oz (63.5 kg)   SpO2 98%   BMI 20.10 kg/m    CC: nasal dryness, anal pain, emotional support animal Subjective:   HPI: Albert Salas is a 24 y.o. male presenting on 03/19/2020 for Nose Problem (C/o dry nasal passages.  ), Medication Problem (C/o anal pain after taking Adderall. ), and Form Completion (Wants to discuss forms needed for emotional support animal. 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   Several months of dry nasal passage.  Started overusing afrin nasal spray. Stopped afrin 1.5 months ago.  Recently quit smoking 1.5 months ago.  Ongoing nasal and sinus congestion, sneezing, nose feels blocked up alternating sides. Some ear pain now resolved.  Has tried neti pot and nasal saline irrigation with limited benefit. No nosebleeds.   No fevers/chills, ST, PNDrainage, tooth pain, headaches. No cough.   Notes perianal irritation/pain when he takes adderall. About to start new job and needs stimulant whenever he's reading or needs to focus. Wonders about switch to different stimulant. Endorses poor diet but no constipation/straining. Had normal colonoscopy 08/2019.   H/o anxiety/depression and ADHD. Requests updated letter for emotional support animal. Moving for 4 months to [place] then [place] for 1 yr. To work with [employer].      Relevant past medical, surgical, family and social history reviewed and updated as indicated. Interim medical history since our last visit reviewed. Allergies and medications reviewed and updated. Outpatient Medications Prior to Visit  Medication Sig Dispense Refill  . albuterol (PROAIR HFA) 108 (90 Base) MCG/ACT inhaler Inhale 2 puffs into  the lungs every 6 (six) hours as needed for wheezing or shortness of breath. 18 g 3  . amphetamine-dextroamphetamine (ADDERALL) 10 MG tablet Take 1 tablet (10 mg total) by mouth daily as needed (attention). 30 tablet 0  . hydrocortisone (ANUSOL-HC) 2.5 % rectal cream Place 1 application rectally 2 (two) times daily. 30 g 1   No facility-administered medications prior to visit.     Per HPI unless specifically indicated in ROS section below Review of Systems Objective:  BP 118/80 (BP Location: Left Arm, Patient Position: Sitting, Cuff Size: Normal)   Pulse 74   Temp 98.3 F (36.8 C) (Temporal)   Ht 5\' 10"  (1.778 m)   Wt 140 lb 1 oz (63.5 kg)   SpO2 98%   BMI 20.10 kg/m   Wt Readings from Last 3 Encounters:  03/19/20 140 lb 1 oz (63.5 kg)  09/23/19 139 lb (63 kg)  08/16/19 139 lb 2 oz (63.1 kg)      Physical Exam Vitals and nursing note reviewed.  Constitutional:      Appearance: Normal appearance. He is not ill-appearing.  HENT:     Head: Normocephalic and atraumatic.     Nose: Nose normal. No mucosal edema, congestion or rhinorrhea.     Right Nostril: No foreign body, epistaxis, septal hematoma or occlusion.     Left Nostril: No foreign body, epistaxis, septal hematoma or occlusion.     Right Turbinates: Not enlarged, swollen or pale.     Left Turbinates: Not enlarged, swollen or pale.  Comments: No polyps appreciated. Erythema to nasal mucosa    Mouth/Throat:     Mouth: Mucous membranes are moist.     Pharynx: Oropharynx is clear. Posterior oropharyngeal erythema (posterior oropharynx) present. No oropharyngeal exudate.  Eyes:     Extraocular Movements: Extraocular movements intact.     Pupils: Pupils are equal, round, and reactive to light.  Musculoskeletal:     Cervical back: Normal range of motion and neck supple. No rigidity.  Lymphadenopathy:     Cervical: No cervical adenopathy.  Skin:    General: Skin is warm and dry.     Findings: No rash.  Neurological:      Mental Status: He is alert.  Psychiatric:        Mood and Affect: Mood normal.        Behavior: Behavior normal.       Assessment & Plan:  This visit occurred during the SARS-CoV-2 public health emergency.  Safety protocols were in place, including screening questions prior to the visit, additional usage of staff PPE, and extensive cleaning of exam room while observing appropriate contact time as indicated for disinfecting solutions.   Problem List Items Addressed This Visit    Rectal pain - Primary    Has not responded to conservative management with sitz baths, or anusol cream or suppositories. Has seen gen surgery with reassuring evaluation. Now notes symptoms only come on when he takes adderall - wonders if side effect to medication. See above.  Encouraged sufficient daily water and fiber intake.       Erythematous nasal mucosa    Started after afrin overuse. Ongoing difficulty with nasal mucosal irritation and inflammation. rec start flonase + nasal saline throughout the day. No signs of nasal polyps or ongoing infection.      Depression with anxiety    Ongoing, off medication.  Animal helps him with mood.  Overall stable period.       ADD (attention deficit disorder)    Does well with adderall for ADHD however concern for side effect of anal pain as he only notes it on days he takes thsi stimulant. Will trial different family of stimulant ritalin. Update with effect.           Meds ordered this encounter  Medications  . methylphenidate (RITALIN) 5 MG tablet    Sig: Take 1 tablet (5 mg total) by mouth daily as needed (ADHD).    Dispense:  30 tablet    Refill:  0   No orders of the defined types were placed in this encounter.   Patient Instructions  Send [place] info for emotional support application (our fax #[phone]).  Ensure good fiber and water in diet.  Ok to try ritalin different family of stimulants. Update [place] with effect.  For nose - try flonase nasal  steroid over the counter as well as continue nasal saline irrigation. Hold flonase if nosebleed develops.   Follow up plan: No follow-ups on file.  Ria Bush, MD

## 2020-03-19 NOTE — Assessment & Plan Note (Addendum)
Has not responded to conservative management with sitz baths, or anusol cream or suppositories. Has seen gen surgery with reassuring evaluation. Now notes symptoms only come on when he takes adderall - wonders if side effect to medication. See above.  Encouraged sufficient daily water and fiber intake.

## 2020-03-19 NOTE — Patient Instructions (Addendum)
Send [place] info for emotional support application (our fax #[phone]).  Ensure good fiber and water in diet.  Ok to try ritalin different family of stimulants. Update [place] with effect.  For nose - try flonase nasal steroid over the counter as well as continue nasal saline irrigation. Hold flonase if nosebleed develops.

## 2020-03-20 ENCOUNTER — Telehealth: Payer: Self-pay | Admitting: Family Medicine

## 2020-03-20 MED ORDER — FLUTICASONE PROPIONATE 50 MCG/ACT NA SUSP: 2.0000 | Freq: Every day | NASAL | 1 refills | Status: DC

## 2020-03-20 NOTE — Telephone Encounter (Signed)
flonase sent to pharmacy. This is OTC.

## 2020-03-20 NOTE — Telephone Encounter (Signed)
Placed form in Dr. G's box.  

## 2020-03-20 NOTE — Telephone Encounter (Signed)
Received forms via email. Printed off and placed in [place] tower.

## 2020-03-20 NOTE — Telephone Encounter (Signed)
Patient called in stating that he has gone to the pharmacy 2x and has not received the script from visit yesterday. Stated it was received as of 3:47. Asking for it to be resent and a call given when confirmed. Please advise.

## 2020-03-20 NOTE — Telephone Encounter (Signed)
Spoke with pharmacy asking about methylphenidate.  Says pt picked up refill this AM.  Spoke with pt to confirm.  Says, yes, he got that medication.  But he was referring to the nasal spray Dr. Darnell Level was going to send.  After checking yesterday's OV note, I do see where Dr. Darnell Level was going to start Flonase.  Informed pt I will remind Dr. Darnell Level about rx.  Pt also wants to make Dr. Darnell Level aware that he will send/drop off ppw for support animal.

## 2020-03-20 NOTE — Addendum Note (Signed)
Addended by: Ria Bush on: 03/20/2020 02:06 PM   Modules accepted: Orders

## 2020-03-21 NOTE — Telephone Encounter (Addendum)
Attempted to contact pt.  No answer.  Vm box full.  Need to notify pt form is ready to pick up and relay Dr. Synthia Innocent message.   [Placed form at front office- yellow folders.]

## 2020-03-21 NOTE — Telephone Encounter (Signed)
Filled and in Lisa's box. Forms ask if pt is disabled - I have to answer no to this question.  Will see what his housing says.

## 2020-03-22 NOTE — Telephone Encounter (Signed)
Left message on vm per dpr notifying pt the form is ready to pick up and I relayed Dr. Synthia Innocent message.  Informed pt we close today at 1:00 and will not reopen until Mon, 03/26/20 at 8:00.

## 2020-03-26 NOTE — Telephone Encounter (Signed)
Pt returning call requesting form be mailed to him.    Mailed form.

## 2020-04-12 ENCOUNTER — Other Ambulatory Visit: Payer: Self-pay | Admitting: Family Medicine

## 2020-05-16 ENCOUNTER — Telehealth: Payer: Self-pay | Admitting: Family Medicine

## 2020-05-16 NOTE — Telephone Encounter (Signed)
E-scribed refill.  Plz schedule lab and cpe visits.  

## 2020-05-21 NOTE — Telephone Encounter (Signed)
Called and left vm for the patient to call and schedule cpe and labs per DPR> EM

## 2020-05-21 NOTE — Telephone Encounter (Signed)
Noted  

## 2020-05-22 NOTE — Telephone Encounter (Signed)
Patient has moved and will no longer be using our practice as his primary care team. EM

## 2020-05-22 NOTE — Telephone Encounter (Signed)
Noted  

## 2020-05-30 ENCOUNTER — Other Ambulatory Visit: Payer: Self-pay | Admitting: Family Medicine

## 2020-06-29 ENCOUNTER — Other Ambulatory Visit: Payer: Self-pay

## 2020-06-29 MED ORDER — HYDROCORTISONE (PERIANAL) 2.5 % EX CREA
1.0000 | TOPICAL_CREAM | Freq: Two times a day (BID) | CUTANEOUS | 0 refills | Status: DC
Start: 2020-06-29 — End: 2020-10-05

## 2020-06-29 NOTE — Telephone Encounter (Signed)
ERx 

## 2020-06-29 NOTE — Telephone Encounter (Signed)
Pt is asking for a refill of hydrocortisone [facility] [address], [place], [place]

## 2020-08-29 ENCOUNTER — Telehealth: Payer: Self-pay | Admitting: Family Medicine

## 2020-08-29 NOTE — Telephone Encounter (Signed)
Pt called in wanted to know about getting another letter for service animal he misplaced and needs another one.

## 2020-08-29 NOTE — Telephone Encounter (Signed)
Spoke with pt informing him we would need to either mail letter or pick it up from our office.  Pt requested letter be mailed to following new address:  [address] [place]  [address]  Mailed letter.  Plz update pt's chart.

## 2020-08-29 NOTE — Telephone Encounter (Signed)
And the email that he can send the leter to is  [email or web address]

## 2020-10-04 ENCOUNTER — Telehealth: Payer: Self-pay

## 2020-10-04 NOTE — Telephone Encounter (Signed)
Patient called in stating that he wanted an appointment with Dr.G tomorrow so he is able to get a refill on his adderall. Advised that we didn't have anything tomorrow but could work patient in next Wednesday, patient declined saying he has moved and will not be around. Then states he will try to find another PCP closer to him.

## 2020-10-04 NOTE — Telephone Encounter (Signed)
Spoke with pt scheduling him at 9:00 tomorrow.  Pt expresses his thanks.

## 2020-10-05 ENCOUNTER — Encounter: Payer: Self-pay | Admitting: Family Medicine

## 2020-10-05 ENCOUNTER — Other Ambulatory Visit: Payer: Self-pay

## 2020-10-05 ENCOUNTER — Ambulatory Visit: Payer: BC Managed Care – PPO | Admitting: Family Medicine

## 2020-10-05 VITALS — BP 118/72 | HR 54 | Temp 97.7°F | Ht 70.0 in | Wt 155.0 lb

## 2020-10-05 DIAGNOSIS — F988 Other specified behavioral and emotional disorders with onset usually occurring in childhood and adolescence: Secondary | ICD-10-CM

## 2020-10-05 MED ORDER — METHYLPHENIDATE HCL 10 MG PO TABS: 10.0000 mg | ORAL_TABLET | Freq: Every day | ORAL | 0 refills | Status: DC | PRN

## 2020-10-05 MED ORDER — FLUTICASONE PROPIONATE 50 MCG/ACT NA SUSP: NASAL | 11 refills | Status: DC

## 2020-10-05 NOTE — Patient Instructions (Signed)
You are doing well today Ritalin refilled Let us know if you'd like records sent to new doc in [place].

## 2020-10-05 NOTE — Progress Notes (Signed)
Patient ID: Albert Salas, male    DOB: 1996/02/22, 25 y.o.   MRN: [id number]  This visit was conducted in person.  BP 118/72   Pulse (!) 54   Temp 97.7 F (36.5 C) (Temporal)   Ht 5\' 10"  (1.778 m)   Wt 155 lb (70.3 kg)   SpO2 98%   BMI 22.24 kg/m    CC: ADD f/u visit  Subjective:   HPI: Albert Salas is a 25 y.o. male presenting on 10/05/2020 for ADD (Here for f/u.)   Recently moved to [place] to establish there.   Did see [facility] in [place] for CPE 05/2020. Received ritalin #30 at that time.   Anxiety/depression and ADD - concern adderall was causing rectal pain so 02/2020 we changed to ritalin 5mg  to take PRN - better tolerated however not noticing significant benefit on this medicine. Takes in the morning.   No chest pain, HA, insomnia, anorexia.      Relevant past medical, surgical, family and social history reviewed and updated as indicated. Interim medical history since our last visit reviewed. Allergies and medications reviewed and updated. Outpatient Medications Prior to Visit  Medication Sig Dispense Refill   amphetamine-dextroamphetamine (ADDERALL) 10 MG tablet Take 1 tablet (10 mg total) by mouth daily as needed (attention). 30 tablet 0   fluticasone (FLONASE) 50 MCG/ACT nasal spray SPRAY 2 SPRAYS INTO EACH NOSTRIL EVERY DAY 16 mL 0   methylphenidate (RITALIN) 5 MG tablet Take 1 tablet (5 mg total) by mouth daily as needed (ADHD). 30 tablet 0   albuterol (PROAIR HFA) 108 (90 Base) MCG/ACT inhaler Inhale 2 puffs into the lungs every 6 (six) hours as needed for wheezing or shortness of breath. 18 g 3   hydrocortisone (ANUSOL-HC) 2.5 % rectal cream Place 1 application rectally 2 (two) times daily. 30 g 0   No facility-administered medications prior to visit.     Per HPI unless specifically indicated in ROS section below Review of Systems  Objective:  BP 118/72   Pulse (!) 54   Temp 97.7 F (36.5 C) (Temporal)   Ht 5\' 10"  (1.778  m)   Wt 155 lb (70.3 kg)   SpO2 98%   BMI 22.24 kg/m   Wt Readings from Last 3 Encounters:  10/05/20 155 lb (70.3 kg)  03/19/20 140 lb 1 oz (63.5 kg)  09/23/19 139 lb (63 kg)      Physical Exam Vitals and nursing note reviewed.  Constitutional:      Appearance: Normal appearance. He is not ill-appearing.  Cardiovascular:     Rate and Rhythm: Normal rate and regular rhythm.     Pulses: Normal pulses.     Heart sounds: Normal heart sounds. No murmur heard. Pulmonary:     Effort: Pulmonary effort is normal. No respiratory distress.     Breath sounds: Normal breath sounds. No wheezing, rhonchi or rales.  Musculoskeletal:     Right lower leg: No edema.     Left lower leg: No edema.  Skin:    Findings: No rash.  Neurological:     Mental Status: He is alert.  Psychiatric:        Mood and Affect: Mood normal.        Behavior: Behavior normal.       Assessment & Plan:  This visit occurred during the SARS-CoV-2 public health emergency.  Safety protocols were in place, including screening questions prior to the visit, additional usage of  staff PPE, and extensive cleaning of exam room while observing appropriate contact time as indicated for disinfecting solutions.   Problem List Items Addressed This Visit     ADD (attention deficit disorder) - Primary    Not noticing significant effect with ritalin 5mg  PRN - will increase dose to 10mg . Discussed anticipated duration of effect of 4-5 hours. Update with effect.  Will send in 3 month supply. If continued refills from our office, will need updated UDS/controlled substance agreement.  He is in process of establishing with new provider in [place] where he's moved. Still regularly visits family locally.          Meds ordered this encounter  Medications   fluticasone (FLONASE) 50 MCG/ACT nasal spray    Sig: SPRAY 2 SPRAYS INTO EACH NOSTRIL EVERY DAY    Dispense:  16 mL    Refill:  11   methylphenidate (RITALIN) 10 MG tablet     Sig: Take 1 tablet (10 mg total) by mouth daily as needed (ADHD).    Dispense:  30 tablet    Refill:  0   methylphenidate (RITALIN) 10 MG tablet    Sig: Take 1 tablet (10 mg total) by mouth daily as needed (ADD).    Dispense:  30 tablet    Refill:  0   methylphenidate (RITALIN) 10 MG tablet    Sig: Take 1 tablet (10 mg total) by mouth daily as needed (ADD).    Dispense:  30 tablet    Refill:  0   No orders of the defined types were placed in this encounter.    Patient Instructions  You are doing well today Ritalin refilled Let us know if you'd like records sent to new doc in [place].   Follow up plan: Return if symptoms worsen or fail to improve.  Ria Bush, MD

## 2020-10-05 NOTE — Assessment & Plan Note (Addendum)
Not noticing significant effect with ritalin 5mg  PRN - will increase dose to 10mg . Discussed anticipated duration of effect of 4-5 hours. Update with effect.  Will send in 3 month supply. If continued refills from our office, will need updated UDS/controlled substance agreement.  He is in process of establishing with new provider in [place] where he's moved. Still regularly visits family locally.

## 2021-10-23 ENCOUNTER — Telehealth: Payer: Self-pay | Admitting: Family Medicine

## 2021-10-23 NOTE — Telephone Encounter (Signed)
Patient called and stated that he is changing providers and is sending over medical records authorization form for alderal to our email. Call back [phone].

## 2021-10-23 NOTE — Telephone Encounter (Signed)
Fyi to Dr. Darnell Level and Amy.

## 2021-10-23 NOTE — Telephone Encounter (Signed)
Noted. He moved to [place] last year.  Plz send records as requested.

## 2021-10-25 ENCOUNTER — Ambulatory Visit
Admit: 2021-10-25 | Discharge: 2021-10-25 | Payer: BLUE CROSS/BLUE SHIELD | Attending: Family Medicine | Primary: Family Medicine

## 2021-10-25 DIAGNOSIS — R41840 Attention and concentration deficit: Secondary | ICD-10-CM
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
## 2021-10-29 NOTE — Telephone Encounter (Signed)
Received records from [facility]. See attached.

## 2021-10-29 NOTE — Telephone Encounter (Signed)
Pt agreed to below message, appt made for 11/29/2021 for follow up

## 2021-10-29 NOTE — Telephone Encounter (Signed)
Your records from Dr. Sharen Hones have been reviewed and the ADHD diagnosis confirmed. If you are interested in restarting the Adderall, you'll need to agree to stopping the marijuana. If you agree then I can place a short refill today and we meet in a month to get a urine done.

## 2021-10-29 NOTE — Telephone Encounter (Signed)
I have changed my mind about starting the adderal without passing a urine test. I am still willing to start the adderall but you'll need to be able to provide a urine drug screen that tests negative for illicit substances first.

## 2021-10-29 NOTE — Telephone Encounter (Signed)
MyChart sent.

## 2021-11-01 NOTE — Telephone Encounter (Signed)
Patient called to see which pharmacy his medication had been sent to; patient informed that the provider had changed his mind and will not be sending in there Adderall until the patient passes the drug screening. Patient was very upset, stating "that's not what he told me" and "he lied to me!". Patient states that it was very difficult for him to make it to that appointment, that he had to cancel a "very  important meeting" to be here and now the provider does this. Patient stated that he will be finding another provider.

## 2021-11-29 ENCOUNTER — Encounter: Payer: BLUE CROSS/BLUE SHIELD | Attending: Family Medicine | Primary: Family Medicine

## 2023-03-20 ENCOUNTER — Ambulatory Visit (INDEPENDENT_AMBULATORY_CARE_PROVIDER_SITE_OTHER): Payer: Self-pay | Admitting: Family Medicine

## 2023-03-20 ENCOUNTER — Encounter: Payer: Self-pay | Admitting: Family Medicine

## 2023-03-20 VITALS — BP 134/84 | HR 85 | Temp 98.1°F | Ht 70.0 in | Wt 136.2 lb

## 2023-03-20 DIAGNOSIS — F411 Generalized anxiety disorder: Secondary | ICD-10-CM

## 2023-03-20 DIAGNOSIS — F332 Major depressive disorder, recurrent severe without psychotic features: Secondary | ICD-10-CM

## 2023-03-20 DIAGNOSIS — F900 Attention-deficit hyperactivity disorder, predominantly inattentive type: Secondary | ICD-10-CM

## 2023-03-20 MED ORDER — AMPHETAMINE-DEXTROAMPHETAMINE 10 MG PO TABS: 10.0000 mg | ORAL_TABLET | Freq: Two times a day (BID) | ORAL | 0 refills | Status: AC | PRN

## 2023-03-20 MED ORDER — FLUOXETINE HCL 20 MG PO CAPS: 20.0000 mg | ORAL_CAPSULE | Freq: Every day | ORAL | 2 refills | Status: DC

## 2023-03-20 MED ORDER — HYDROXYZINE HCL 25 MG PO TABS: 12.5000 mg | ORAL_TABLET | Freq: Two times a day (BID) | ORAL | 0 refills | Status: DC | PRN

## 2023-03-20 NOTE — Progress Notes (Unsigned)
Ph: [phone] Fax: [phone]   Patient ID: Albert Salas, male    DOB: [birth year], 27 y.o.   MRN: [id number]  This visit was conducted in person.  BP 134/84   Pulse 85   Temp 98.1 F (36.7 C) (Oral)   Ht 5\' 10"  (1.778 m)   Wt 136 lb 4 oz (61.8 kg)   SpO2 99%   BMI 19.55 kg/m    CC: ADHD f/u visit  Subjective:   HPI: Albert Salas is a 27 y.o. male presenting on 03/20/2023 for Medical Management of Chronic Issues (Here for ADHD f/u. Pt accompanied by friend, Wannetta Sender. )   Last seen 09/2020 Moved to [place] in interim.  Then lived in [place].   Graduated from college in Manufacturing engineer. Was living on his own - has been on the road a lot.  Here with family friend Wannetta Sender - he's recently started living with her and her husband, as well as their son who he's friends with.   Started new job a month ago - struggling.  Currently self pay - insurance will take effect in 90 days.   Anxiety and depression markedly high over the past 1 year.  Trouble keeping a job "getting in my own way".  He acknowledges he is intelligent but second guessing and anxiety is debilitating,   Some SI when symptoms severe, never with a plan.   Good effect with prozac in the past, didn't tolerate welbutrin. Has tolerated lorazepam well in the past.   ADHD diagnosed in 4th grade s/p psychological testing - no records available. Previously difficulty with tolerating Ritalin. Most recently managed with adderall 30mg  IR daily PRN.   He mentions intermittent MJ use.      Relevant past medical, surgical, family and social history reviewed and updated as indicated. Interim medical history since our last visit reviewed. Allergies and medications reviewed and updated. Outpatient Medications Prior to Visit  Medication Sig Dispense Refill   fluticasone (FLONASE) 50 MCG/ACT nasal spray SPRAY 2 SPRAYS INTO EACH NOSTRIL EVERY DAY 16 mL 11   methylphenidate (RITALIN) 10 MG  tablet Take 1 tablet (10 mg total) by mouth daily as needed (ADHD). 30 tablet 0   methylphenidate (RITALIN) 10 MG tablet Take 1 tablet (10 mg total) by mouth daily as needed (ADD). 30 tablet 0   methylphenidate (RITALIN) 10 MG tablet Take 1 tablet (10 mg total) by mouth daily as needed (ADD). 30 tablet 0   No facility-administered medications prior to visit.     Per HPI unless specifically indicated in ROS section below Review of Systems  Objective:  BP 134/84   Pulse 85   Temp 98.1 F (36.7 C) (Oral)   Ht 5\' 10"  (1.778 m)   Wt 136 lb 4 oz (61.8 kg)   SpO2 99%   BMI 19.55 kg/m   Wt Readings from Last 3 Encounters:  03/20/23 136 lb 4 oz (61.8 kg)  10/05/20 155 lb (70.3 kg)  03/19/20 140 lb 1 oz (63.5 kg)      Physical Exam Vitals and nursing note reviewed.  Constitutional:      Appearance: Normal appearance. He is not ill-appearing.  HENT:     Mouth/Throat:     Mouth: Mucous membranes are moist.     Pharynx: Oropharynx is clear. No oropharyngeal exudate or posterior oropharyngeal erythema.  Eyes:     Extraocular Movements: Extraocular movements intact.     Pupils: Pupils are equal, round, and reactive  to light.  Neck:     Thyroid: No thyroid mass or thyromegaly.  Cardiovascular:     Rate and Rhythm: Normal rate and regular rhythm.     Pulses: Normal pulses.     Heart sounds: Normal heart sounds. No murmur heard. Pulmonary:     Effort: Pulmonary effort is normal. No respiratory distress.     Breath sounds: Normal breath sounds. No wheezing, rhonchi or rales.  Musculoskeletal:     Cervical back: Normal range of motion and neck supple.     Right lower leg: No edema.     Left lower leg: No edema.  Skin:    General: Skin is warm and dry.  Neurological:     Mental Status: He is alert.  Psychiatric:        Attention and Perception: Attention normal.        Mood and Affect: Mood is anxious and depressed.        Speech: Speech normal.        Behavior: Behavior normal.         Thought Content: Thought content normal.        Cognition and Memory: Cognition and memory normal.        Judgment: Judgment normal.     Comments: Tearful with discussion of anxiety/stressors           03/20/2023    2:01 PM 04/04/2019    2:01 PM 09/25/2015    5:08 PM 08/23/2015    4:19 PM  Depression screen PHQ 2/9  Decreased Interest 3 0 2 3  Down, Depressed, Hopeless 3 0 1 2  PHQ - 2 Score 6 0 3 5  Altered sleeping 3  2 3   Tired, decreased energy 3  1 2   Change in appetite 3  1 3   Feeling bad or failure about yourself  3  0 2  Trouble concentrating 2  1 2   Moving slowly or fidgety/restless 3  3 3   Suicidal thoughts 1  0 0  PHQ-9 Score 24  11 20   Difficult doing work/chores Extremely dIfficult  Somewhat difficult Extremely dIfficult       03/20/2023    2:02 PM 09/25/2015    5:09 PM 08/23/2015    4:18 PM  GAD 7 : Generalized Anxiety Score  Nervous, Anxious, on Edge 3 1 3   Control/stop worrying 3 0 2  Worry too much - different things 3 0 3  Trouble relaxing 3 1 3   Restless 3 1 1   Easily annoyed or irritable 3 1 2   Afraid - awful might happen 3 0 3  Total GAD 7 Score 21 4 17   Anxiety Difficulty Extremely difficult Not difficult at all    Assessment & Plan:   Problem List Items Addressed This Visit     GAD (generalized anxiety disorder)   Deteriorated control affecting him at home, work, and socially.  Reviewed pathophysiology of anxiety > depression disorders resulting in hypervigilance, therapeutic benefits and mechanism of action for antidepressants to treat depression/anxiety.  Start Prozac 20mg  daily which was previously effective. Start hydroxyzine 25mg  1/2-1 tab bid PRN anxiety. Avoid futher controlled substances for now (benzo).  Recommend limiting cannabis which can lead to worsened anxiety.  Reviewed increased suicidality risk with pt and family friend Paula Compton and to stop immediately and let me know if this develops. Friend will closely monitor him as we  start prozac.  He would be interested in counseling - will let me know when he receives  insurance for referral. Briefly discussed online intensive outpatient program such as [facility].       Relevant Medications   FLUoxetine (PROZAC) 20 MG capsule   hydrOXYzine (ATARAX) 25 MG tablet   ADD (attention deficit disorder) - Primary   Worsening difficulty, affecting ability to get work done, affecting ability to keep jobs.  He's previously had trouble tolerating stimulants due to side effects, overstimulation, worsening anxiety.  Will restart adderall at a lower dose (he's been taking 30mg  daily) - will start Adderall IR 10mg  BID PRN. Discussed 4-6 hour duration of effect. RTC 2 mo when he has insurance for physical and follow up.   Discussed pros and cons of controlled substances and expectations to receive prescription from our office. Patient is not to abuse, misuse, divert or use medication other than as prescribed. Patient is not to seek controlled substances from other clinics or multiple pharmacies. Patient is not to use illegal drugs. Discussed risks of medication including dependence, tolerance, and addiction/abuse potential. Patient will establish or update controlled substance agreement and complete urine drug screen as necessary.        MDD (major depressive disorder), recurrent episode, severe (HCC)   Deteriorated control affecting him at home, work, and socially.  Reviewed pathophysiology of anxiety > depression disorders resulting in hypervigilance, therapeutic benefits and mechanism of action for antidepressants to treat depression/anxiety.  Start Prozac 20mg  daily which was previously effective. Start hydroxyzine 25mg  1/2-1 tab bid PRN anxiety. Avoid futher controlled substances for now (benzo).  Recommend limiting cannabis which can lead to worsened anxiety.  Reviewed increased suicidality risk with pt and family friend Paula Compton and to stop immediately and let me know if this  develops. Friend will closely monitor him as we start prozac.  He would be interested in counseling - will let me know when he receives insurance for referral. Briefly discussed online intensive outpatient program such as [facility].       Relevant Medications   FLUoxetine (PROZAC) 20 MG capsule   hydrOXYzine (ATARAX) 25 MG tablet     Meds ordered this encounter  Medications   FLUoxetine (PROZAC) 20 MG capsule    Sig: Take 1 capsule (20 mg total) by mouth daily.    Dispense:  90 capsule    Refill:  2   amphetamine-dextroamphetamine (ADDERALL) 10 MG tablet    Sig: Take 1 tablet (10 mg total) by mouth 2 (two) times daily as needed (ADHD).    Dispense:  60 tablet    Refill:  0   hydrOXYzine (ATARAX) 25 MG tablet    Sig: Take 0.5-1 tablets (12.5-25 mg total) by mouth 2 (two) times daily as needed for anxiety (with sedation precautions).    Dispense:  30 tablet    Refill:  0    No orders of the defined types were placed in this encounter.   Patient Instructions  Restart adderall IR 30mg  daily  Start prozac (fluoxetine) 20mg  daily in the morning.  May use hydroxyzine as needed for anxiety, sleep  Let me know when you get insurance for referral to counselor.  Return to see me in 2 months.   Follow up plan: Return in about 2 months (around 05/21/2023) for annual exam, prior fasting for blood work.  Eustaquio Boyden, MD

## 2023-03-20 NOTE — Patient Instructions (Addendum)
Restart adderall IR 30mg  daily  Start prozac (fluoxetine) 20mg  daily in the morning.  May use hydroxyzine as needed for anxiety, sleep  Let me know when you get insurance for referral to counselor.  Return to see me in 2 months.

## 2023-03-21 DIAGNOSIS — F332 Major depressive disorder, recurrent severe without psychotic features: Secondary | ICD-10-CM | POA: Insufficient documentation

## 2023-03-21 NOTE — Assessment & Plan Note (Signed)
Worsening difficulty, affecting ability to get work done, affecting ability to keep jobs.  He's previously had trouble tolerating stimulants due to side effects, overstimulation, worsening anxiety.  Will restart adderall at a lower dose (he's been taking 30mg  daily) - will start Adderall IR 10mg  BID PRN. Discussed 4-6 hour duration of effect. RTC 2 mo when he has insurance for physical and follow up.   Discussed pros and cons of controlled substances and expectations to receive prescription from our office. Patient is not to abuse, misuse, divert or use medication other than as prescribed. Patient is not to seek controlled substances from other clinics or multiple pharmacies. Patient is not to use illegal drugs. Discussed risks of medication including dependence, tolerance, and addiction/abuse potential. Patient will establish or update controlled substance agreement and complete urine drug screen as necessary.

## 2023-03-21 NOTE — Assessment & Plan Note (Signed)
Deteriorated control affecting him at home, work, and socially.  Reviewed pathophysiology of anxiety > depression disorders resulting in hypervigilance, therapeutic benefits and mechanism of action for antidepressants to treat depression/anxiety.  Start Prozac 20mg  daily which was previously effective. Start hydroxyzine 25mg  1/2-1 tab bid PRN anxiety. Avoid futher controlled substances for now (benzo).  Recommend limiting cannabis which can lead to worsened anxiety.  Reviewed increased suicidality risk with pt and family friend Paula Compton and to stop immediately and let me know if this develops. Friend will closely monitor him as we start prozac.  He would be interested in counseling - will let me know when he receives insurance for referral. Briefly discussed online intensive outpatient program such as [facility].

## 2023-03-21 NOTE — Assessment & Plan Note (Signed)
Deteriorated control affecting him at home, work, and socially.  Reviewed pathophysiology of anxiety > depression disorders resulting in hypervigilance, therapeutic benefits and mechanism of action for antidepressants to treat depression/anxiety.  Start Prozac 20mg  daily which was previously effective. Start hydroxyzine 25mg  1/2-1 tab bid PRN anxiety. Avoid futher controlled substances for now (benzo).  Recommend limiting cannabis which can lead to worsened anxiety.  Reviewed increased suicidality risk with pt and family friend Albert Salas and to stop immediately and let me know if this develops. Friend will closely monitor him as we start prozac.  He would be interested in counseling - will let me know when he receives insurance for referral. Briefly discussed online intensive outpatient program such as [facility].

## 2023-03-27 ENCOUNTER — Other Ambulatory Visit: Payer: Self-pay | Admitting: Family Medicine

## 2023-03-27 DIAGNOSIS — F411 Generalized anxiety disorder: Secondary | ICD-10-CM

## 2023-03-27 NOTE — Telephone Encounter (Signed)
Message from pharmacy:  REQUEST FOR 90 DAYS PRESCRIPTION.   Hydroxyzine Last filled:  03/20/23, #30 Last OV:  03/20/23, ADHD f/u Next OV:  06/12/23, CPE

## 2023-03-29 NOTE — Telephone Encounter (Signed)
ERx 

## 2023-03-30 ENCOUNTER — Telehealth: Payer: Self-pay

## 2023-04-13 NOTE — Telephone Encounter (Signed)
 Error

## 2023-04-14 ENCOUNTER — Other Ambulatory Visit: Payer: Self-pay | Admitting: Family Medicine

## 2023-04-14 DIAGNOSIS — F411 Generalized anxiety disorder: Secondary | ICD-10-CM

## 2023-04-14 NOTE — Telephone Encounter (Signed)
 Message from pharmacy:  REQUEST FOR 90 DAYS PRESCRIPTION.   Hydroxyzine Last rx:  03/29/23, #60 Last OV:  03/20/23, ADHD f/u Next OV:  06/12/23, CPE

## 2023-04-16 NOTE — Telephone Encounter (Signed)
ERx #60, RF 6.

## 2023-05-07 ENCOUNTER — Ambulatory Visit (INDEPENDENT_AMBULATORY_CARE_PROVIDER_SITE_OTHER): Payer: Self-pay | Admitting: Internal Medicine

## 2023-05-07 ENCOUNTER — Encounter: Payer: Self-pay | Admitting: Internal Medicine

## 2023-05-07 ENCOUNTER — Telehealth: Payer: Self-pay

## 2023-05-07 VITALS — BP 110/60 | HR 99 | Temp 100.1°F | Ht 71.0 in | Wt 142.0 lb

## 2023-05-07 DIAGNOSIS — J069 Acute upper respiratory infection, unspecified: Secondary | ICD-10-CM | POA: Insufficient documentation

## 2023-05-07 MED ORDER — DOXYCYCLINE HYCLATE 100 MG PO TABS: 100.0000 mg | ORAL_TABLET | Freq: Two times a day (BID) | ORAL | 0 refills | Status: DC

## 2023-05-07 NOTE — Progress Notes (Signed)
 Subjective:    Patient ID: Albert Salas, male    DOB: Jun 17, 1995, 28 y.o.   MRN: [id number]  HPI Here due to respiratory infection  Started 4-5 days ago Seemed like a mild cold at first Some cough and congestion Some sore throat and ear pressure Headache on top Some mucus in cough Some post nasal drip  Slowly has gotten more body aches and chills Some temperature today  Taking advil, tylenol and benedryl---not clearly helpful  Missed work today  Current Outpatient Medications on File Prior to Visit  Medication Sig Dispense Refill   amphetamine -dextroamphetamine  (ADDERALL) 10 MG tablet Take 1 tablet (10 mg total) by mouth 2 (two) times daily as needed (ADHD). 60 tablet 0   FLUoxetine  (PROZAC ) 20 MG capsule Take 1 capsule (20 mg total) by mouth daily. 90 capsule 2   No current facility-administered medications on file prior to visit.    No Known Allergies  Past Medical History:  Diagnosis Date   ADHD (attention deficit hyperactivity disorder) 2008   dx in 4th grade, s/p psychological testing (no records), prior tried intuniv and vyvanse   Asthma    Depression with anxiety    Headache    Heartburn    History of chicken pox     Past Surgical History:  Procedure Laterality Date   COLONOSCOPY  08/2019   hypertrophied anal papillae, 1mm hyperplastic rectal polyp ([place])    Family History  Problem Relation Age of Onset   Diabetes Father    Depression Mother    Colon cancer Maternal Grandmother    Leukemia Maternal Grandmother    Cancer Other        ?uterine/ovarian   Irritable bowel syndrome Sister    Lung cancer Maternal Grandfather    Diabetes Paternal Grandfather    Diabetes Paternal Uncle    Heart disease Paternal Uncle    CAD Neg Hx    Kidney disease Neg Hx    Prostate cancer Neg Hx    Stomach cancer Neg Hx    Rectal cancer Neg Hx    Esophageal cancer Neg Hx     Social History   Socioeconomic History   Marital status: Single    Spouse  name: Not on file   Number of children: 0   Years of education: Not on file   Highest education level: Not on file  Occupational History   Occupation: Microbiologist  Tobacco Use   Smoking status: Former   Smokeless tobacco: Never  Vaping Use   Vaping status: Never Used  Substance and Sexual Activity   Alcohol use: Yes    Alcohol/week: 0.0 standard drinks of alcohol    Comment: 1 every other day   Drug use: Yes    Types: Marijuana    Comment: MJ a few times   Sexual activity: Not Currently  Other Topics Concern   Not on file  Social History Narrative   Lives with parents, 2 dogs   Edu: [employer] - graduated 02/2020.    Enjoys playing gigs. Plays sax, bass guitar, clarinet and flute.    Occ: [employer]   Activity: working out, biking   Diet: good water, fruits/vegetables daily   Social Drivers of Corporate Investment Banker Strain: Not on file  Food Insecurity: No Food Insecurity (06/11/2020)   Received from [facility], [facility]   Hunger Vital Sign    Worried About Running Out of Food in the Last Year:  Never true    Ran Out of Food in the Last Year: Never true  Transportation Needs: Not on file  Physical Activity: Not on file  Stress: Not on file  Social Connections: Unknown (08/13/2021)   Received from [facility], [facility]   Social Network    Social Network: Not on file  Intimate Partner Violence: Unknown (07/05/2021)   Received from [facility], [facility]   HITS    Physically Hurt: Not on file    Insult or Talk Down To: Not on file    Threaten Physical Harm: Not on file    Scream or Curse: Not on file   Review of Systems Some loss of smell or taste No N/V Able to eat    Objective:   Physical Exam Constitutional:      Appearance: Normal appearance.  HENT:     Head:     Comments: No sinus tenderness    Right Ear: Tympanic membrane and ear canal normal.     Left Ear:  Tympanic membrane and ear canal normal.     Mouth/Throat:     Pharynx: No oropharyngeal exudate or posterior oropharyngeal erythema.  Pulmonary:     Effort: Pulmonary effort is normal.     Breath sounds: Normal breath sounds. No wheezing or rales.  Musculoskeletal:     Cervical back: Neck supple.  Lymphadenopathy:     Cervical: No cervical adenopathy.  Neurological:     Mental Status: He is alert.            Assessment & Plan:

## 2023-05-07 NOTE — Telephone Encounter (Signed)
 Copied from CRM [id number]. Topic: Clinical - Medication Question >> May 07, 2023 10:20 AM Corin V wrote: Reason for CRM: Patient is coming in for a 2:15 sick visit and he is currently uninsured. He wanted to see if Dr. Rilla would be willing to prescribe anything for his flu symptoms so he can avoid having the self-pay cost for the visit and to free it up for another patient. Please let patient know so he can decide to keep or cancel the appointment.

## 2023-05-07 NOTE — Telephone Encounter (Signed)
 Copied from CRM [id number]. Topic: Clinical - Medication Question >> May 07, 2023 10:20 AM Corin V wrote: Reason for CRM: Patient is coming in for a 2:15 sick visit and he is currently uninsured. He wanted to see if Dr. Rilla would be willing to prescribe anything for his flu symptoms so he can avoid having the self-pay cost for the visit and to free it up for another patient. Please let patient know so he can decide to keep or cancel the appointment.   Called patient let him know Dr. Rilla is not in the office today. And Either way he would need to be evaluated in order to be prescribed any treatment. Patient will keep appointment for today.

## 2023-05-07 NOTE — Assessment & Plan Note (Signed)
 Not consistent with flu A Could be COVID--but 5 days now and I wouldn't treat this He is worse today--I wonder about a secondary bacterial sinus infection Continue analgesics If worsens, should fill doxy 100  bid x 7 days

## 2023-06-12 ENCOUNTER — Encounter: Payer: Self-pay | Admitting: Family Medicine

## 2023-06-12 ENCOUNTER — Ambulatory Visit (INDEPENDENT_AMBULATORY_CARE_PROVIDER_SITE_OTHER): Payer: Self-pay | Admitting: Family Medicine

## 2023-06-12 VITALS — BP 128/70 | HR 69 | Temp 98.2°F | Ht 69.5 in | Wt 140.4 lb

## 2023-06-12 DIAGNOSIS — F332 Major depressive disorder, recurrent severe without psychotic features: Secondary | ICD-10-CM

## 2023-06-12 DIAGNOSIS — Z113 Encounter for screening for infections with a predominantly sexual mode of transmission: Secondary | ICD-10-CM

## 2023-06-12 DIAGNOSIS — F411 Generalized anxiety disorder: Secondary | ICD-10-CM | POA: Diagnosis not present

## 2023-06-12 DIAGNOSIS — F900 Attention-deficit hyperactivity disorder, predominantly inattentive type: Secondary | ICD-10-CM

## 2023-06-12 DIAGNOSIS — Z13220 Encounter for screening for lipoid disorders: Secondary | ICD-10-CM | POA: Diagnosis not present

## 2023-06-12 DIAGNOSIS — Z0000 Encounter for general adult medical examination without abnormal findings: Secondary | ICD-10-CM

## 2023-06-12 DIAGNOSIS — Z136 Encounter for screening for cardiovascular disorders: Secondary | ICD-10-CM

## 2023-06-12 DIAGNOSIS — R5382 Chronic fatigue, unspecified: Secondary | ICD-10-CM

## 2023-06-12 DIAGNOSIS — Z1159 Encounter for screening for other viral diseases: Secondary | ICD-10-CM

## 2023-06-12 DIAGNOSIS — Z23 Encounter for immunization: Secondary | ICD-10-CM | POA: Diagnosis not present

## 2023-06-12 DIAGNOSIS — Z0001 Encounter for general adult medical examination with abnormal findings: Secondary | ICD-10-CM | POA: Insufficient documentation

## 2023-06-12 LAB — CBC WITH DIFFERENTIAL/PLATELET
Basophils Absolute: 0.1 10*3/uL (ref 0.0–0.1)
Basophils Relative: 1.1 % (ref 0.0–3.0)
Eosinophils Absolute: 0.1 10*3/uL (ref 0.0–0.7)
Eosinophils Relative: 2.7 % (ref 0.0–5.0)
HCT: 45.2 % (ref 39.0–52.0)
Hemoglobin: 15.1 g/dL (ref 13.0–17.0)
Lymphocytes Relative: 30 % (ref 12.0–46.0)
Lymphs Abs: 1.6 10*3/uL (ref 0.7–4.0)
MCHC: 33.4 g/dL (ref 30.0–36.0)
MCV: 92 fl (ref 78.0–100.0)
Monocytes Absolute: 0.4 10*3/uL (ref 0.1–1.0)
Monocytes Relative: 7.9 % (ref 3.0–12.0)
Neutro Abs: 3 10*3/uL (ref 1.4–7.7)
Neutrophils Relative %: 58.3 % (ref 43.0–77.0)
Platelets: 249 10*3/uL (ref 150.0–400.0)
RBC: 4.91 Mil/uL (ref 4.22–5.81)
RDW: 13.4 % (ref 11.5–15.5)
WBC: 5.2 10*3/uL (ref 4.0–10.5)

## 2023-06-12 LAB — COMPREHENSIVE METABOLIC PANEL
ALT: 14 U/L (ref 0–53)
AST: 12 U/L (ref 0–37)
Albumin: 4.7 g/dL (ref 3.5–5.2)
Alkaline Phosphatase: 50 U/L (ref 39–117)
BUN: 14 mg/dL (ref 6–23)
CO2: 29 meq/L (ref 19–32)
Calcium: 9.7 mg/dL (ref 8.4–10.5)
Chloride: 103 meq/L (ref 96–112)
Creatinine, Ser: 0.77 mg/dL (ref 0.40–1.50)
GFR: 123.02 mL/min (ref >60.00)
Glucose, Bld: 91 mg/dL (ref 70–99)
Potassium: 4.3 meq/L (ref 3.5–5.1)
Sodium: 139 meq/L (ref 135–145)
Total Bilirubin: 0.6 mg/dL (ref 0.2–1.2)
Total Protein: 7 g/dL (ref 6.0–8.3)

## 2023-06-12 LAB — LIPID PANEL
Cholesterol: 144 mg/dL (ref 0–200)
HDL: 52.3 mg/dL (ref >39.00)
LDL Cholesterol: 72 mg/dL (ref 0–99)
NonHDL: 91.48
Total CHOL/HDL Ratio: 3
Triglycerides: 96 mg/dL (ref 0.0–149.0)
VLDL: 19.2 mg/dL (ref 0.0–40.0)

## 2023-06-12 LAB — TSH: TSH: 0.77 u[IU]/mL (ref 0.35–5.50)

## 2023-06-12 LAB — VITAMIN D 25 HYDROXY (VIT D DEFICIENCY, FRACTURES): VITD: 19.19 ng/mL — ABNORMAL LOW (ref 30.00–100.00)

## 2023-06-12 LAB — VITAMIN B12: Vitamin B-12: 434 pg/mL (ref 211–911)

## 2023-06-12 MED ORDER — FLUOXETINE HCL 40 MG PO CAPS: 40.0000 mg | ORAL_CAPSULE | Freq: Every day | ORAL | 4 refills | Status: AC

## 2023-06-12 NOTE — Progress Notes (Signed)
 Ph: [phone] Fax: [phone]   Patient ID: Albert Salas, male    DOB: [birth year], 28 y.o.   MRN: [id number]  This visit was conducted in person.  BP 128/70   Pulse 69   Temp 98.2 F (36.8 C) (Oral)   Ht 5' 9.5" (1.765 m)   Wt 140 lb 6 oz (63.7 kg)   SpO2 97%   BMI 20.43 kg/m    CC: CPE Subjective:   HPI: Albert Salas is a 28 y.o. male presenting on 06/12/2023 for Annual Exam   New job at [employer] in [employer].   See prior note for details.  Severe anxiety/depression and ADHD acutely worse noted last visit, we started prozac 20mg  daily and restarted adderall 10mg  BID PRN. Stopped hydroxyzine PRN - may have worsened anxiety.   Notes ongoing chronic fatigue. Nonrestorative sleep, daytime somnolence. No morning headaches, no witnessed apnea or snoring.   Preventative: COLONOSCOPY 08/2019 - hypertrophied anal papillae, 1mm hyperplastic rectal polyp ([place])  Prostate cancer screening - not due Lung cancer screening - not due Flu shot - defer for now  COVID shot - 06/2019, 07/2019  Tdap 07/2007, update today  Pneumonia shot - not due Shingrix - not due Advanced directive discussion - did not discuss  Seat belt use discussed Sunscreen use discussed. No changing moles on skin.  Smoking - none Alcohol  - none MJ - regular Dentist - due. He brushes teeth and flosses regularly Eye exam - not recently  Currently dating - not sexually active. 2 partners in th past year   Lives in [place] with family friends  Edu: [employer] - graduated 02/2020. Enjoys playing gigs. Plays sax, bass guitar, clarinet and flute. Occ: [employer] Activity: working out, biking Diet: good water, fruits/vegetables daily     Relevant past medical, surgical, family and social history reviewed and updated as indicated. Interim medical history since our last visit reviewed. Allergies and medications reviewed and  updated. Outpatient Medications Prior to Visit  Medication Sig Dispense Refill   amphetamine-dextroamphetamine (ADDERALL) 10 MG tablet Take 1 tablet (10 mg total) by mouth 2 (two) times daily as needed (ADHD). 60 tablet 0   FLUoxetine (PROZAC) 20 MG capsule Take 1 capsule (20 mg total) by mouth daily. 90 capsule 2   doxycycline (VIBRA-TABS) 100 MG tablet Take 1 tablet (100 mg total) by mouth 2 (two) times daily. 14 tablet 0   No facility-administered medications prior to visit.     Per HPI unless specifically indicated in ROS section below Review of Systems  Constitutional:  Negative for activity change, appetite change, chills, fatigue, fever and unexpected weight change.  HENT:  Positive for congestion (recent URI). Negative for hearing loss.   Eyes:  Negative for visual disturbance.  Respiratory:  Positive for cough (residual from prior URI). Negative for chest tightness, shortness of breath and wheezing.   Cardiovascular:  Negative for chest pain, palpitations and leg swelling.  Gastrointestinal:  Positive for blood in stool (hemorrhoid related) and diarrhea (intermittent). Negative for abdominal distention, abdominal pain, constipation, nausea and vomiting.  Genitourinary:  Negative for difficulty urinating and hematuria.  Musculoskeletal:  Negative for arthralgias, myalgias and neck pain.  Skin:  Negative for rash.  Neurological:  Negative for dizziness, seizures, syncope and headaches.  Hematological:  Negative for adenopathy. Does not bruise/bleed easily.  Psychiatric/Behavioral:  Positive for dysphoric mood. The patient is nervous/anxious.     Objective:  BP 128/70  Pulse 69   Temp 98.2 F (36.8 C) (Oral)   Ht 5' 9.5" (1.765 m)   Wt 140 lb 6 oz (63.7 kg)   SpO2 97%   BMI 20.43 kg/m   Wt Readings from Last 3 Encounters:  06/12/23 140 lb 6 oz (63.7 kg)  05/07/23 142 lb (64.4 kg)  03/20/23 136 lb 4 oz (61.8 kg)      Physical Exam Vitals and nursing note reviewed.   Constitutional:      General: He is not in acute distress.    Appearance: Normal appearance. He is well-developed. He is not ill-appearing.  HENT:     Head: Normocephalic and atraumatic.     Right Ear: Hearing, tympanic membrane, ear canal and external ear normal.     Left Ear: Hearing, tympanic membrane, ear canal and external ear normal.     Mouth/Throat:     Mouth: Mucous membranes are moist.     Pharynx: Oropharynx is clear. No oropharyngeal exudate or posterior oropharyngeal erythema.  Eyes:     General: No scleral icterus.    Extraocular Movements: Extraocular movements intact.     Conjunctiva/sclera: Conjunctivae normal.     Pupils: Pupils are equal, round, and reactive to light.  Neck:     Thyroid: No thyroid mass or thyromegaly.  Cardiovascular:     Rate and Rhythm: Normal rate and regular rhythm.     Pulses: Normal pulses.          Radial pulses are 2+ on the right side and 2+ on the left side.     Heart sounds: Normal heart sounds. No murmur heard. Pulmonary:     Effort: Pulmonary effort is normal. No respiratory distress.     Breath sounds: Normal breath sounds. No wheezing, rhonchi or rales.  Abdominal:     General: Bowel sounds are normal. There is no distension.     Palpations: Abdomen is soft. There is no mass.     Tenderness: There is no abdominal tenderness. There is no guarding or rebound.     Hernia: No hernia is present.  Musculoskeletal:        General: Normal range of motion.     Cervical back: Normal range of motion and neck supple.     Right lower leg: No edema.     Left lower leg: No edema.  Lymphadenopathy:     Cervical: No cervical adenopathy.  Skin:    General: Skin is warm and dry.     Findings: No rash.  Neurological:     General: No focal deficit present.     Mental Status: He is alert and oriented to person, place, and time.  Psychiatric:        Mood and Affect: Mood normal.        Behavior: Behavior normal.        Thought Content:  Thought content normal.        Judgment: Judgment normal.       Results for orders placed or performed in visit on 08/16/19  C-reactive protein   Collection Time: 08/16/19  2:33 PM  Result Value Ref Range   CRP <1.0 0.5 - 20.0 mg/dL  Sed Rate (ESR)   Collection Time: 08/16/19  2:33 PM  Result Value Ref Range   Sed Rate 1 0 - 15 mm/hr  TSH   Collection Time: 08/16/19  2:33 PM  Result Value Ref Range   TSH 0.87 0.35 - 4.50 uIU/mL  Comp Met (CMET)  Collection Time: 08/16/19  2:33 PM  Result Value Ref Range   Sodium 138 135 - 145 mEq/L   Potassium 4.2 3.5 - 5.1 mEq/L   Chloride 103 96 - 112 mEq/L   CO2 32 19 - 32 mEq/L   Glucose, Bld 100 (H) 70 - 99 mg/dL   BUN 12 6 - 23 mg/dL   Creatinine, Ser 8.11 0.40 - 1.50 mg/dL   Total Bilirubin 1.4 (H) 0.2 - 1.2 mg/dL   Alkaline Phosphatase 49 39 - 117 U/L   AST 18 0 - 37 U/L   ALT 15 0 - 53 U/L   Total Protein 7.4 6.0 - 8.3 g/dL   Albumin 4.7 3.5 - 5.2 g/dL   GFR 914.78 >29.56 mL/min   Calcium 9.5 8.4 - 10.5 mg/dL  CBC with Differential/Platelet   Collection Time: 08/16/19  2:33 PM  Result Value Ref Range   WBC 5.0 4.0 - 10.5 K/uL   RBC 5.42 4.22 - 5.81 Mil/uL   Hemoglobin 16.4 13.0 - 17.0 g/dL   HCT 21.3 08.6 - 57.8 %   MCV 91.2 78.0 - 100.0 fl   MCHC 33.1 30.0 - 36.0 g/dL   RDW 46.9 62.9 - 52.8 %   Platelets 198.0 150.0 - 400.0 K/uL   Neutrophils Relative % 66.8 43.0 - 77.0 %   Lymphocytes Relative 18.5 12.0 - 46.0 %   Monocytes Relative 12.8 (H) 3.0 - 12.0 %   Eosinophils Relative 1.0 0.0 - 5.0 %   Basophils Relative 0.9 0.0 - 3.0 %   Neutro Abs 3.3 1.4 - 7.7 K/uL   Lymphs Abs 0.9 0.7 - 4.0 K/uL   Monocytes Absolute 0.6 0.1 - 1.0 K/uL   Eosinophils Absolute 0.1 0.0 - 0.7 K/uL   Basophils Absolute 0.0 0.0 - 0.1 K/uL      06/12/2023   12:39 PM 03/20/2023    2:01 PM 04/04/2019    2:01 PM 09/25/2015    5:08 PM 08/23/2015    4:19 PM  Depression screen PHQ 2/9  Decreased Interest 1 3 0 2 3  Down, Depressed, Hopeless 1 3  0 1 2  PHQ - 2 Score 2 6 0 3 5  Altered sleeping 3 3  2 3   Tired, decreased energy 3 3  1 2   Change in appetite 3 3  1 3   Feeling bad or failure about yourself  2 3  0 2  Trouble concentrating 2 2  1 2   Moving slowly or fidgety/restless 1 3  3 3   Suicidal thoughts 0 1  0 0  PHQ-9 Score 16 24  11 20   Difficult doing work/chores Somewhat difficult Extremely dIfficult  Somewhat difficult Extremely dIfficult       06/12/2023   12:39 PM 03/20/2023    2:02 PM 09/25/2015    5:09 PM 08/23/2015    4:18 PM  GAD 7 : Generalized Anxiety Score  Nervous, Anxious, on Edge 1 3 1 3   Control/stop worrying 1 3 0 2  Worry too much - different things 1 3 0 3  Trouble relaxing 3 3 1 3   Restless 3 3 1 1   Easily annoyed or irritable 3 3 1 2   Afraid - awful might happen 1 3 0 3  Total GAD 7 Score 13 21 4 17   Anxiety Difficulty Somewhat difficult Extremely difficult Not difficult at all    Assessment & Plan:   Problem List Items Addressed This Visit     Encounter for general adult medical  examination with abnormal findings - Primary (Chronic)   Preventative protocols reviewed and updated unless pt declined. Discussed healthy diet and lifestyle.       GAD (generalized anxiety disorder)   Improving but not yet at goal based on PHQ9/GAD7 scores. Increase prozac to 40mg  daily.  RTC 3-4 mo mood f/u visit.       Relevant Medications   FLUoxetine (PROZAC) 40 MG capsule   ADD (attention deficit disorder)   Tolerating adderall 10mg  BID PRN well, has not needed to take twice daily yet.  Last filled #60 on 03/2023 - does not yet need refill.       MDD (major depressive disorder), recurrent episode, severe (HCC)   Improving however not at goal - increase prozac to 40mg  daily as per below. RTC 3-4 mo f/u visit       Relevant Medications   FLUoxetine (PROZAC) 40 MG capsule   Chronic fatigue   Endorses longstanding history of this.  Start evaluation with labwork for reversible causes.  ESS = 21.   Consider T testing.  Consider HST pending lab results today - he agrees.       Relevant Orders   TSH   CBC with Differential/Platelet   Vitamin B12   VITAMIN D 25 Hydroxy (Vit-D Deficiency, Fractures)   Comprehensive metabolic panel   Other Visit Diagnoses       Screen for STD (sexually transmitted disease)       Relevant Orders   HIV Antibody (routine testing w rflx)   RPR   C. trachomatis/N. gonorrhoeae RNA     Need for hepatitis C screening test       Relevant Orders   Hepatitis C antibody     Encounter for lipid screening for cardiovascular disease       Relevant Orders   Lipid panel        Meds ordered this encounter  Medications   FLUoxetine (PROZAC) 40 MG capsule    Sig: Take 1 capsule (40 mg total) by mouth daily.    Dispense:  90 capsule    Refill:  4    Orders Placed This Encounter  Procedures   C. trachomatis/N. gonorrhoeae RNA   Tdap vaccine greater than or equal to 7yo IM   Lipid panel   TSH   CBC with Differential/Platelet   Vitamin B12   VITAMIN D 25 Hydroxy (Vit-D Deficiency, Fractures)   HIV Antibody (routine testing w rflx)   Hepatitis C antibody   RPR   Comprehensive metabolic panel    Patient Instructions  Tetanus and whooping cough today  Labs and urine test today  Increase prozac to 40mg  daily Return in 3-4 months for follow up visit   Follow up plan: Return in about 3 months (around 09/12/2023) for follow up visit.  Eustaquio Boyden, MD

## 2023-06-12 NOTE — Assessment & Plan Note (Signed)
 Tolerating adderall 10mg  BID PRN well, has not needed to take twice daily yet.  Last filled #60 on 03/2023 - does not yet need refill.

## 2023-06-12 NOTE — Assessment & Plan Note (Signed)
 Improving however not at goal - increase prozac to 40mg  daily as per below. RTC 3-4 mo f/u visit

## 2023-06-12 NOTE — Patient Instructions (Addendum)
 Tetanus and whooping cough today  Labs and urine test today  Increase prozac to 40mg  daily Return in 3-4 months for follow up visit

## 2023-06-12 NOTE — Assessment & Plan Note (Signed)
 Preventative protocols reviewed and updated unless pt declined. Discussed healthy diet and lifestyle.

## 2023-06-12 NOTE — Assessment & Plan Note (Addendum)
 Endorses longstanding history of this.  Start evaluation with labwork for reversible causes.  ESS = 21.  Consider T testing.  Consider HST pending lab results today - he agrees.

## 2023-06-12 NOTE — Assessment & Plan Note (Addendum)
 Improving but not yet at goal based on PHQ9/GAD7 scores. Increase prozac to 40mg  daily.  RTC 3-4 mo mood f/u visit.

## 2023-06-13 LAB — C. TRACHOMATIS/N. GONORRHOEAE RNA
C. trachomatis RNA, TMA: NOT DETECTED
N. gonorrhoeae RNA, TMA: NOT DETECTED

## 2023-06-13 LAB — HEPATITIS C ANTIBODY: Hepatitis C Ab: NONREACTIVE

## 2023-06-13 LAB — RPR: RPR Ser Ql: NONREACTIVE

## 2023-06-13 LAB — HIV ANTIBODY (ROUTINE TESTING W REFLEX): HIV 1&2 Ab, 4th Generation: NONREACTIVE

## 2023-06-14 ENCOUNTER — Other Ambulatory Visit: Payer: Self-pay | Admitting: Family Medicine

## 2023-06-14 ENCOUNTER — Encounter: Payer: Self-pay | Admitting: Family Medicine

## 2023-06-14 DIAGNOSIS — E559 Vitamin D deficiency, unspecified: Secondary | ICD-10-CM | POA: Insufficient documentation

## 2023-06-14 MED ORDER — VITAMIN D3 25 MCG (1000 UT) PO CAPS: 1.0000 | ORAL_CAPSULE | Freq: Every day | ORAL | Status: AC

## 2023-06-17 ENCOUNTER — Telehealth: Payer: Self-pay | Admitting: Family Medicine

## 2023-06-17 DIAGNOSIS — R400 Somnolence: Secondary | ICD-10-CM

## 2023-06-17 NOTE — Telephone Encounter (Addendum)
 Reasonable to proceed with home sleep test given high sleepiness score, also reasonable to give vit D replacement some time to take effect and see if it would help fatigue.   I have gone ahead and placed referral for [facility] to mail him a home sleep test to perform.  To let us know if not contacted over the next 1-2 weeks.   [facility] order form filled out and placed in Lisa's box. Plz print out insurance info to send with form.

## 2023-06-17 NOTE — Telephone Encounter (Signed)
 Copied from CRM [id number]. Topic: General - Other >> Jun 16, 2023  3:46 PM Rodman Pickle T wrote: Reason for CRM: patient thinks he may have the newest strain of covid virus he is not feeling well and wants to know how to treat this he would like a call back today

## 2023-06-17 NOTE — Telephone Encounter (Signed)
 Called patient states he did not call about covid symptoms. Did state that he had questions regarding the sleep study test that you talked to him about at last visit. Would like to know if he needs to do anything to get that set up or if they will reach out to him. Sending error report to check call and see if this documentation was made under wrong person.

## 2023-06-18 NOTE — Telephone Encounter (Signed)
 Faxed order on 06/17/23 to [facility] at [phone].  Spoke with pt relaying Dr Timoteo Expose message and notify pt order was faxed. Pt verbalizes understanding and will start vit D.

## 2023-07-20 ENCOUNTER — Telehealth: Payer: Self-pay

## 2023-07-20 NOTE — Telephone Encounter (Addendum)
 Received faxed message from [facility] stating they have been  unsuccessful in contacting pt to complete the registration process for the Home Sleep Test ordered by Dr Crissie Dome. They are asking pt to call them back at [phone] so they can proceed.   Left message on vm, per dpr, relaying message above.

## 2023-07-22 NOTE — Telephone Encounter (Signed)
 Received faxed message from [facility] stating they have been  unsuccessful in contacting pt to complete the registration process for the Home Sleep Test ordered by Dr Crissie Dome. They are asking pt to call them back at [phone] so they can proceed.   Left message on vm, per dpr, relaying message above.

## 2023-07-23 NOTE — Telephone Encounter (Signed)
 Received faxed message from [facility] stating they have been  unsuccessful in contacting pt to complete the registration process for the Home Sleep Test ordered by Dr Crissie Dome. They are asking pt to call them back at [phone] so they can proceed.   Left message on vm, per dpr, relaying message above.  Mailing a letter.

## 2023-09-11 ENCOUNTER — Ambulatory Visit: Payer: Self-pay | Admitting: Family Medicine
